# Patient Record
Sex: Male | Born: 1955 | Race: Asian | Hispanic: No | Marital: Married | Smoking: Never smoker
Health system: Southern US, Community
[De-identification: ages and names within clinical notes are randomized; demographics above are authoritative.]

## PROBLEM LIST (undated history)

## (undated) DIAGNOSIS — E7800 Pure hypercholesterolemia, unspecified: Secondary | ICD-10-CM

## (undated) DIAGNOSIS — N281 Cyst of kidney, acquired: Secondary | ICD-10-CM

---

## 2018-07-13 ENCOUNTER — Emergency Department (HOSPITAL_COMMUNITY): Payer: Self-pay

## 2018-07-13 ENCOUNTER — Inpatient Hospital Stay (HOSPITAL_COMMUNITY)
Admission: EM | Disposition: A | Discharge status: Home / Self Care | Payer: Self-pay | Source: Home / Self Care | Attending: Cardiovascular Disease

## 2018-07-13 ENCOUNTER — Inpatient Hospital Stay (HOSPITAL_COMMUNITY): Payer: Self-pay

## 2018-07-13 ENCOUNTER — Other Ambulatory Visit: Payer: Self-pay

## 2018-07-13 ENCOUNTER — Other Ambulatory Visit (HOSPITAL_COMMUNITY): Payer: Self-pay

## 2018-07-13 ENCOUNTER — Encounter (HOSPITAL_COMMUNITY): Payer: Self-pay | Admitting: Emergency Medicine

## 2018-07-13 ENCOUNTER — Inpatient Hospital Stay (HOSPITAL_COMMUNITY)
Admission: EM | Admit: 2018-07-13 | Discharge: 2018-07-17 | DRG: 281 | Disposition: A | Discharge status: Home / Self Care | Payer: Self-pay | Attending: Cardiovascular Disease | Admitting: Cardiovascular Disease

## 2018-07-13 DIAGNOSIS — I213 ST elevation (STEMI) myocardial infarction of unspecified site: Secondary | ICD-10-CM

## 2018-07-13 DIAGNOSIS — R079 Chest pain, unspecified: Secondary | ICD-10-CM

## 2018-07-13 DIAGNOSIS — I2510 Atherosclerotic heart disease of native coronary artery without angina pectoris: Secondary | ICD-10-CM

## 2018-07-13 DIAGNOSIS — I2102 ST elevation (STEMI) myocardial infarction involving left anterior descending coronary artery: Secondary | ICD-10-CM

## 2018-07-13 DIAGNOSIS — E785 Hyperlipidemia, unspecified: Secondary | ICD-10-CM | POA: Diagnosis present

## 2018-07-13 DIAGNOSIS — I25119 Atherosclerotic heart disease of native coronary artery with unspecified angina pectoris: Secondary | ICD-10-CM

## 2018-07-13 DIAGNOSIS — I97410 Intraoperative hemorrhage and hematoma of a circulatory system organ or structure complicating a cardiac catheterization: Secondary | ICD-10-CM

## 2018-07-13 DIAGNOSIS — I2541 Coronary artery aneurysm: Secondary | ICD-10-CM | POA: Diagnosis present

## 2018-07-13 DIAGNOSIS — I255 Ischemic cardiomyopathy: Secondary | ICD-10-CM | POA: Diagnosis present

## 2018-07-13 DIAGNOSIS — Z8249 Family history of ischemic heart disease and other diseases of the circulatory system: Secondary | ICD-10-CM

## 2018-07-13 DIAGNOSIS — I97630 Postprocedural hematoma of a circulatory system organ or structure following a cardiac catheterization: Secondary | ICD-10-CM | POA: Diagnosis not present

## 2018-07-13 DIAGNOSIS — E7800 Pure hypercholesterolemia, unspecified: Secondary | ICD-10-CM | POA: Diagnosis present

## 2018-07-13 HISTORY — PX: LEFT HEART CATH AND CORONARY ANGIOGRAPHY: CATH118249

## 2018-07-13 HISTORY — DX: Pure hypercholesterolemia, unspecified: E78.00

## 2018-07-13 HISTORY — DX: Cyst of kidney, acquired: N28.1

## 2018-07-13 LAB — CBC
HCT: 47.3 % (ref 39.0–52.0)
Hemoglobin: 15 g/dL (ref 13.0–17.0)
MCH: 27.8 pg (ref 26.0–34.0)
MCHC: 31.7 g/dL (ref 30.0–36.0)
MCV: 87.8 fL (ref 80.0–100.0)
Platelets: 212 10*3/uL (ref 150–400)
RBC: 5.39 MIL/uL (ref 4.22–5.81)
RDW: 12.5 % (ref 11.5–15.5)
WBC: 11.7 10*3/uL — ABNORMAL HIGH (ref 4.0–10.5)
nRBC: 0 % (ref 0.0–0.2)

## 2018-07-13 LAB — BASIC METABOLIC PANEL
Anion gap: 13 (ref 5–15)
BUN: 16 mg/dL (ref 8–23)
CO2: 24 mmol/L (ref 22–32)
Calcium: 9.4 mg/dL (ref 8.9–10.3)
Chloride: 102 mmol/L (ref 98–111)
Creatinine, Ser: 1.17 mg/dL (ref 0.61–1.24)
GFR calc Af Amer: >60 mL/min (ref >60)
GFR calc non Af Amer: >60 mL/min (ref >60)
Glucose, Bld: 129 mg/dL — ABNORMAL HIGH (ref 70–99)
Potassium: 3.3 mmol/L — ABNORMAL LOW (ref 3.5–5.1)
Sodium: 139 mmol/L (ref 135–145)

## 2018-07-13 LAB — ECHOCARDIOGRAM COMPLETE: Weight: 2720 oz

## 2018-07-13 LAB — TROPONIN I
Troponin I: 5.52 ng/mL (ref <0.03)
Troponin I: >65 ng/mL (ref <0.03)
Troponin I: >65 ng/mL (ref <0.03)

## 2018-07-13 LAB — MRSA PCR SCREENING: MRSA by PCR: NEGATIVE

## 2018-07-13 LAB — I-STAT TROPONIN, ED: Troponin i, poc: 0.01 ng/mL (ref 0.00–0.08)

## 2018-07-13 LAB — HEPARIN LEVEL (UNFRACTIONATED): Heparin Unfractionated: 0.34 IU/mL (ref 0.30–0.70)

## 2018-07-13 LAB — HIV ANTIBODY (ROUTINE TESTING W REFLEX): HIV Screen 4th Generation wRfx: NONREACTIVE

## 2018-07-13 LAB — PROTIME-INR
INR: 0.98
Prothrombin Time: 12.9 seconds (ref 11.4–15.2)

## 2018-07-13 LAB — ABO/RH: ABO/RH(D): A POS

## 2018-07-13 LAB — TYPE AND SCREEN
ABO/RH(D): A POS
Antibody Screen: NEGATIVE

## 2018-07-13 SURGERY — LEFT HEART CATH AND CORONARY ANGIOGRAPHY
Anesthesia: LOCAL

## 2018-07-13 MED ORDER — HEPARIN (PORCINE) IN NACL 1000-0.9 UT/500ML-% IV SOLN
INTRAVENOUS | Status: DC | PRN
  Administered 2018-07-13 (×2): 500 mL

## 2018-07-13 MED ORDER — FENTANYL CITRATE (PF) 100 MCG/2ML IJ SOLN
INTRAMUSCULAR | Status: AC
  Administered 2018-07-13: 50 ug
  Filled 2018-07-13: qty 2

## 2018-07-13 MED ORDER — ONDANSETRON HCL 4 MG/2ML IJ SOLN
4.0000 mg | Freq: Four times a day (QID) | INTRAMUSCULAR | Status: DC | PRN
  Administered 2018-07-16: 4 mg via INTRAVENOUS
  Filled 2018-07-13: qty 2

## 2018-07-13 MED ORDER — NITROGLYCERIN 0.4 MG SL SUBL
0.4000 mg | SUBLINGUAL_TABLET | SUBLINGUAL | Status: AC | PRN
  Administered 2018-07-13: 0.4 mg via SUBLINGUAL

## 2018-07-13 MED ORDER — SODIUM CHLORIDE 0.9 % IV SOLN
INTRAVENOUS | Status: AC | PRN
  Administered 2018-07-13: 999 mL/h via INTRAVENOUS
  Administered 2018-07-13: 75 mL/h via INTRAVENOUS

## 2018-07-13 MED ORDER — NITROGLYCERIN 0.4 MG SL SUBL
SUBLINGUAL_TABLET | SUBLINGUAL | Status: AC
  Filled 2018-07-13: qty 1

## 2018-07-13 MED ORDER — VERAPAMIL HCL 2.5 MG/ML IV SOLN
INTRAVENOUS | Status: AC
  Filled 2018-07-13: qty 2

## 2018-07-13 MED ORDER — IOPAMIDOL (ISOVUE-370) INJECTION 76%
80.0000 mL | Freq: Once | INTRAVENOUS | Status: AC | PRN
  Administered 2018-07-13: 80 mL via INTRAVENOUS

## 2018-07-13 MED ORDER — ACETAMINOPHEN 325 MG PO TABS: 650.0000 mg | ORAL_TABLET | ORAL | Status: DC | PRN

## 2018-07-13 MED ORDER — SODIUM CHLORIDE 0.9% FLUSH
3.0000 mL | Freq: Two times a day (BID) | INTRAVENOUS | Status: DC
  Administered 2018-07-14 – 2018-07-17 (×5): 3 mL via INTRAVENOUS

## 2018-07-13 MED ORDER — MORPHINE SULFATE (PF) 2 MG/ML IV SOLN: 2.0000 mg | INTRAVENOUS | Status: DC | PRN

## 2018-07-13 MED ORDER — IOHEXOL 350 MG/ML SOLN
INTRAVENOUS | Status: DC | PRN
  Administered 2018-07-13: 110 mL via INTRA_ARTERIAL

## 2018-07-13 MED ORDER — LIDOCAINE HCL (PF) 1 % IJ SOLN
INTRAMUSCULAR | Status: AC
  Filled 2018-07-13: qty 30

## 2018-07-13 MED ORDER — SODIUM CHLORIDE 0.9 % IV SOLN
INTRAVENOUS | Status: DC | PRN
  Administered 2018-07-13: 10 mL/h via INTRAVENOUS

## 2018-07-13 MED ORDER — HEPARIN SODIUM (PORCINE) 5000 UNIT/ML IJ SOLN
4000.0000 [IU] | Freq: Once | INTRAMUSCULAR | Status: AC
  Administered 2018-07-13: 4000 [IU] via INTRAVENOUS

## 2018-07-13 MED ORDER — LIDOCAINE HCL (CARDIAC) PF 100 MG/5ML IV SOSY
50.0000 mg | PREFILLED_SYRINGE | Freq: Once | INTRAVENOUS | Status: AC
  Administered 2018-07-13: 50 mg via INTRAVENOUS

## 2018-07-13 MED ORDER — SODIUM CHLORIDE 0.9 % IV SOLN: 250.0000 mL | INTRAVENOUS | Status: DC | PRN

## 2018-07-13 MED ORDER — SODIUM CHLORIDE 0.9 % IV BOLUS
1000.0000 mL | INTRAVENOUS | Status: AC
  Administered 2018-07-13: 1000 mL via INTRAVENOUS

## 2018-07-13 MED ORDER — HEPARIN SODIUM (PORCINE) 5000 UNIT/ML IJ SOLN
INTRAMUSCULAR | Status: AC
  Filled 2018-07-13: qty 1

## 2018-07-13 MED ORDER — NITROGLYCERIN 0.4 MG SL SUBL
0.4000 mg | SUBLINGUAL_TABLET | SUBLINGUAL | Status: DC | PRN
  Administered 2018-07-13: 0.4 mg via SUBLINGUAL
  Filled 2018-07-13: qty 1

## 2018-07-13 MED ORDER — HEPARIN (PORCINE) 25000 UT/250ML-% IV SOLN
1000.0000 [IU]/h | INTRAVENOUS | Status: DC
  Administered 2018-07-13 – 2018-07-16 (×4): 1000 [IU]/h via INTRAVENOUS
  Filled 2018-07-13 (×4): qty 250

## 2018-07-13 MED ORDER — NITROGLYCERIN IN D5W 200-5 MCG/ML-% IV SOLN
0.0000 ug/min | INTRAVENOUS | Status: DC
  Administered 2018-07-13: 5 ug/kg/min via INTRAVENOUS

## 2018-07-13 MED ORDER — SODIUM CHLORIDE 0.9 % IV SOLN: INTRAVENOUS | Status: AC

## 2018-07-13 MED ORDER — ASPIRIN 81 MG PO CHEW
81.0000 mg | CHEWABLE_TABLET | Freq: Every day | ORAL | Status: DC
  Administered 2018-07-14: 81 mg via ORAL
  Filled 2018-07-13: qty 1

## 2018-07-13 MED ORDER — NITROGLYCERIN 0.4 MG SL SUBL
SUBLINGUAL_TABLET | SUBLINGUAL | Status: AC
  Administered 2018-07-13: 0.04 mg
  Filled 2018-07-13: qty 1

## 2018-07-13 MED ORDER — HEPARIN (PORCINE) IN NACL 1000-0.9 UT/500ML-% IV SOLN
INTRAVENOUS | Status: AC
  Filled 2018-07-13: qty 1000

## 2018-07-13 MED ORDER — ASPIRIN EC 81 MG PO TBEC
81.0000 mg | DELAYED_RELEASE_TABLET | Freq: Every day | ORAL | Status: DC
  Administered 2018-07-15 – 2018-07-17 (×3): 81 mg via ORAL
  Filled 2018-07-13 (×4): qty 1

## 2018-07-13 MED ORDER — NITROGLYCERIN IN D5W 200-5 MCG/ML-% IV SOLN
INTRAVENOUS | Status: AC
  Administered 2018-07-13: 5 ug/kg/min via INTRAVENOUS
  Filled 2018-07-13: qty 250

## 2018-07-13 MED ORDER — ASPIRIN 81 MG PO CHEW
324.0000 mg | CHEWABLE_TABLET | Freq: Once | ORAL | Status: AC
  Administered 2018-07-13: 324 mg via ORAL
  Filled 2018-07-13: qty 4

## 2018-07-13 MED ORDER — NITROGLYCERIN 1 MG/10 ML FOR IR/CATH LAB
INTRA_ARTERIAL | Status: AC
  Filled 2018-07-13: qty 10

## 2018-07-13 MED ORDER — ACETAMINOPHEN 325 MG PO TABS
650.0000 mg | ORAL_TABLET | ORAL | Status: DC | PRN
  Filled 2018-07-13: qty 2

## 2018-07-13 MED ORDER — LIDOCAINE HCL (PF) 1 % IJ SOLN
INTRAMUSCULAR | Status: DC | PRN
  Administered 2018-07-13: 25 mL

## 2018-07-13 MED ORDER — SODIUM CHLORIDE 0.9% FLUSH: 3.0000 mL | INTRAVENOUS | Status: DC | PRN

## 2018-07-13 MED ORDER — ONDANSETRON HCL 4 MG/2ML IJ SOLN: 4.0000 mg | Freq: Four times a day (QID) | INTRAMUSCULAR | Status: DC | PRN

## 2018-07-13 MED ORDER — SODIUM CHLORIDE 0.9% FLUSH
3.0000 mL | Freq: Once | INTRAVENOUS | Status: AC
  Administered 2018-07-13: 3 mL via INTRAVENOUS

## 2018-07-13 MED FILL — Medication: Qty: 1 | Status: AC

## 2018-07-13 SURGICAL SUPPLY — 9 items
CATH INFINITI 5FR MULTPACK ANG (CATHETERS) ×2 IMPLANT
CLOSURE MYNX CONTROL 6F/7F (Vascular Products) ×2 IMPLANT
KIT HEART LEFT (KITS) ×2 IMPLANT
PACK CARDIAC CATHETERIZATION (CUSTOM PROCEDURE TRAY) ×2 IMPLANT
SHEATH PINNACLE 6F 10CM (SHEATH) ×2 IMPLANT
SYR MEDRAD MARK 7 150ML (SYRINGE) ×2 IMPLANT
TRANSDUCER W/STOPCOCK (MISCELLANEOUS) ×2 IMPLANT
TUBING CIL FLEX 10 FLL-RA (TUBING) ×2 IMPLANT
WIRE EMERALD 3MM-J .035X150CM (WIRE) ×2 IMPLANT

## 2018-07-13 NOTE — ED Notes (Signed)
Patient keeps repeating that he "doesn't feel well".

## 2018-07-13 NOTE — ED Notes (Signed)
Called for STAT Portable chest x-ray

## 2018-07-13 NOTE — Progress Notes (Signed)
ANTICOAGULATION CONSULT NOTE - Initial Consult  Pharmacy Consult for heparin Indication: chest pain/ACS  No Known Allergies  Patient Measurements: Weight: 170 lb (77.1 kg) Heparin Dosing Weight: 77kg  Vital Signs: Temp: 99.6 F (37.6 C) (02/14 1139) Temp Source: Oral (02/14 1139) BP: 136/74 (02/14 1300) Pulse Rate: 71 (02/14 1300)  Labs: Recent Labs    07/13/18 0422 07/13/18 0718  HGB 15.0  --   HCT 47.3  --   PLT 212  --   LABPROT 12.9  --   INR 0.98  --   CREATININE 1.17  --   TROPONINI  --  5.52*    CrCl cannot be calculated (Unknown ideal weight.).   Medical History: Past Medical History:  Diagnosis Date  . High cholesterol   . Renal cyst      Assessment: 73 yoM admitted as code STEMI. 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 Pharmacy consulted to begin IV heparin for medical management of ACS. Femoral sheath removed at 0530 this am.  Goal of Therapy:  Heparin level 0.3-0.7 units/ml Monitor platelets by anticoagulation protocol: Yes   Plan:  -Start heparin 1000 units/hr with no bolus -Check 6hr heparin level -Follow-up length of therapy and longterm AC plans  Fredonia Highland, PharmD, BCPS Clinical Pharmacist [phone] Please check AMION for all [facility] numbers 07/13/2018

## 2018-07-13 NOTE — ED Notes (Signed)
Vital signs stable. 

## 2018-07-13 NOTE — H&P (Signed)
Cardiology History & Physical    Patient ID: Bryan Vazquez MRN: [id number], DOB: Feb 16, 1956 Date of Encounter: 07/13/2018, 5:01 AM Primary Physician: Patient, No Pcp Per  Chief Complaint: Chest pain   HPI: Bryan Vazquez is a 63 y.o. male with history of HLD who presents with chest pain.  Pt is Mandarin speaking and history is obtained with the aid of an interpreter and the pt's daughter, who is at the bedside.  Pt had an episode of substernal CP yesterday which resolved with an unspecified herbal remedy.  He awoke from sleep at [facility] with acute substernal chest pain.  He was brought to the [facility] ED.  Initial 12 lead showed ST elevation in I and aVL and hyperacute T waves in the anterior leads.  A code STEMI was called.  He was given full dose ASA, 4000 units IV heparin, 3 SL nitro with improvement but not resolution in his chest pain.  He was having frequent ectopy, and was given 50 mg IV lidocaine.  His BP was 130/80s initially, but he dropped his BPs to the 80s-90s after nitro.  Of note, he has no history of smoking, hypertension or diabetes.  He does have a family of history of CAD, with both his brother and father having Mis.  Past Medical History:  Diagnosis Date  . High cholesterol   . Renal cyst      Surgical History: History reviewed. No pertinent surgical history.   Home Meds: Prior to Admission medications   Not on File    Allergies: No Known Allergies  Social History   Socioeconomic History  . Marital status: Married    Spouse name: Not on file  . Number of children: Not on file  . Years of education: Not on file  . Highest education level: Not on file  Occupational History  . Not on file  Social Needs  . Financial resource strain: Not on file  . Food insecurity:    Worry: Not on file    Inability: Not on file  . Transportation needs:    Medical: Not on file    Non-medical: Not on file  Tobacco Use  . Smoking status: Never Smoker  . Smokeless tobacco:  Never Used  Substance and Sexual Activity  . Alcohol use: Never    Frequency: Never  . Drug use: Never  . Sexual activity: Yes  Lifestyle  . Physical activity:    Days per week: Not on file    Minutes per session: Not on file  . Stress: Not on file  Relationships  . Social connections:    Talks on phone: Not on file    Gets together: Not on file    Attends religious service: Not on file    Active member of club or organization: Not on file    Attends meetings of clubs or organizations: Not on file    Relationship status: Not on file  . Intimate partner violence:    Fear of current or ex partner: Not on file    Emotionally abused: Not on file    Physically abused: Not on file    Forced sexual activity: Not on file  Other Topics Concern  . Not on file  Social History Narrative  . Not on file     History reviewed. No pertinent family history.  Review of Systems: All other systems reviewed and are otherwise negative except as noted above.  Labs:  No results found for: WBC, HGB, HCT,  MCV, PLT No results for input(s): NA, K, CL, CO2, BUN, CREATININE, CALCIUM, PROT, BILITOT, ALKPHOS, ALT, AST, GLUCOSE in the last 168 hours.  Invalid input(s): LABALBU No results for input(s): CKTOTAL, CKMB, TROPONINI in the last 72 hours. No results found for: CHOL, HDL, LDLCALC, TRIG No results found for: DDIMER  Radiology/Studies:  Dg Chest Portable 1 View  Result Date: 07/13/2018 CLINICAL DATA:  ST-elevation myocardial infarction EXAM: PORTABLE CHEST 1 VIEW COMPARISON:  None. FINDINGS: Cardiomegaly. The aortic knob is distinct. Separate upper mediastinal widening which is likely vascular tortuosity, no pleural capping or mainstem depression. Vascular congestion without Kerley lines. No effusion or pneumothorax. IMPRESSION: Cardiomegaly and vascular congestion. Electronically Signed   By: Marnee Spring M.D.   On: 07/13/2018 04:31   Wt Readings from Last 3 Encounters:  07/13/18 77.1 kg     EKG: NSR, lateral STEMI.  Physical Exam: Blood pressure 104/76, pulse 63, temperature 97.8 F (36.6 C), resp. rate 20, weight 77.1 kg, SpO2 97 %. There is no height or weight on file to calculate BMI. General: Well developed, well nourished, in no acute distress. Head: Normocephalic, atraumatic, sclera non-icteric, no xanthomas, nares are without discharge.  Neck: Negative for carotid bruits. JVD not elevated. Lungs: Clear bilaterally to auscultation without wheezes, rales, or rhonchi. Breathing is unlabored. Heart: RRR with S1 S2. No murmurs, rubs, or gallops appreciated. Abdomen: Soft, non-tender, non-distended with normoactive bowel sounds. No hepatomegaly. No rebound/guarding. No obvious abdominal masses. Msk:  Strength and tone appear normal for age. Extremities: No clubbing or cyanosis. No edema.  Distal pedal pulses are 2+ and equal bilaterally. Neuro: Alert and oriented X 3. No focal deficit. No facial asymmetry. Moves all extremities spontaneously. Psych:  Responds to questions appropriately with a normal affect.    Assessment and Plan  33M who presents with CP and found to have STEMI.  Plan for emergent cardiac catheterization and possible PCI.  Signed, Esmond Plants, MD 07/13/2018, 5:01 AM

## 2018-07-13 NOTE — ED Notes (Signed)
Paged Stemi 

## 2018-07-13 NOTE — ED Triage Notes (Signed)
Patient c/o CP that started yesterday after taking some "chinese" medication and it resolved. Started having CP tonight and took medicine again with no resolution. Speak Mandarin ([place]). Daughter interpreting for pt

## 2018-07-13 NOTE — Progress Notes (Signed)
   07/13/18 0500  Clinical Encounter Type  Visited With Family  Visit Type Initial;ED;Code (STEMI)  Referral From Other (Comment) (STEMI pg)  Spiritual Encounters  Spiritual Needs Emotional;Other (Comment) (advocate for translation machine use)  Stress Factors  Patient Stress Factors Health changes  Family Stress Factors Loss of control;Lack of knowledge   Responded to STEMI in ED.  Spoke w/ pt's daughter and wife.  Pt's daughter is fluent in [place], but not in medical terms.  Ensured ED Stratus video interpreter went w/ team to CL.  Also spoke to CL team member to ensure they knew to return that Stratus to ED at end of procedure.  Walked daughter and wife to [facility] waiting, CL knows they are there.  Compassionate presence w/ family.  Also spoke w/ 2H and brought their Stratus out to [facility] waiting for when CL dr comes out to talk to family so they can have translation for pt's wife.  Showed family how to use machine, explained that it needs to be returned to [facility] unit when they finish conversation w/ dr in [facility] waiting rm.  Also showed daughter the entrance to 2H and location of doorbell for entrance.  Chaplain remains available.  Margretta Sidle resident, [phone]

## 2018-07-13 NOTE — Progress Notes (Signed)
  Echocardiogram 2D Echocardiogram has been performed.  Bryan Vazquez 07/13/2018, 10:11 AM

## 2018-07-13 NOTE — Progress Notes (Signed)
  Reviewed cath films.  D/w Dr. Allyson Sabal and Dr. Shirlee Latch.  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 Discussed with the patient's daughter aswell.  Corky Crafts, MD

## 2018-07-13 NOTE — ED Notes (Signed)
Family at bedside. 

## 2018-07-13 NOTE — Progress Notes (Signed)
   Patient with unrevascularized anterior MI.  Large anterior wall motion abnormality.  Start IV heparin. May need long term oral anticoagulation to prevent LV thrombus. Consider LifeVest based on EF.  He is visiting from [place].  Await CT results.  If any surgical intervention is needed, he wants to have it in [place].   Corky Crafts, MD

## 2018-07-13 NOTE — ED Provider Notes (Signed)
[facility] Provider Note   CSN: [id number] Arrival date & time: 07/13/18  0404     History   Chief Complaint Chief Complaint  Patient presents with  . Code STEMI    HPI Bryan Vazquez is a 63 y.o. male.  The history is provided by the patient, a relative and the spouse. The history is limited by a language barrier. A language interpreter was used.  Chest Pain  Pain location:  Substernal area Pain radiates to:  Upper back, L shoulder and L arm Pain severity:  Severe Onset quality:  Sudden Duration:  40 minutes Timing:  Constant Progression:  Unchanged Chronicity:  Recurrent (Had an episode during the day yesterday and took some Cong[place] herbal medication and it subsided.  ) Context: at rest   Relieved by:  Nothing Worsened by:  Nothing Ineffective treatments:  None tried Associated symptoms: dizziness   Associated symptoms: no abdominal pain, no cough, no diaphoresis, no nausea, no palpitations and no vomiting   Risk factors: high cholesterol and male sex   Patient with hypercholesterolemia who presents with SSCP that began at 330 am. No f/c/r.  No leg pain.  Has been visiting for the last 6 months. Pain radiates to upper back and LUE.  EKG in triage with STEMI, alert called immediately.    Past Medical History:  Diagnosis Date  . High cholesterol   . Renal cyst     There are no active problems to display for this patient.   History reviewed. No pertinent surgical history.      Home Medications    Prior to Admission medications   Not on File    Family History History reviewed. No pertinent family history.  Social History Social History   Tobacco Use  . Smoking status: Never Smoker  . Smokeless tobacco: Never Used  Substance Use Topics  . Alcohol use: Never    Frequency: Never  . Drug use: Never     Allergies   Patient has no known allergies.   Review of Systems Review of Systems  Constitutional:  Negative for diaphoresis.  Respiratory: Negative for cough.   Cardiovascular: Positive for chest pain. Negative for palpitations.  Gastrointestinal: Negative for abdominal pain, nausea and vomiting.  Neurological: Positive for dizziness.  All other systems reviewed and are negative.    Physical Exam Updated Vital Signs BP 126/70   Pulse 64   Temp 97.8 F (36.6 C)   Resp 18   Wt 77.1 kg   SpO2 96%   Physical Exam Vitals signs and nursing note reviewed.  Constitutional:      Appearance: He is ill-appearing.  HENT:     Head: Normocephalic and atraumatic.     Nose: Nose normal.     Mouth/Throat:     Mouth: Mucous membranes are moist.     Pharynx: Oropharynx is clear.  Eyes:     Conjunctiva/sclera: Conjunctivae normal.     Pupils: Pupils are equal, round, and reactive to light.  Neck:     Musculoskeletal: Normal range of motion and neck supple.     Vascular: No carotid bruit.  Cardiovascular:     Rate and Rhythm: Normal rate and regular rhythm.     Pulses: Normal pulses.     Heart sounds: Normal heart sounds.  Pulmonary:     Effort: Pulmonary effort is normal.     Breath sounds: Normal breath sounds. No rales.  Abdominal:     General: Abdomen is flat.  Bowel sounds are normal.     Tenderness: There is no abdominal tenderness. There is no guarding or rebound.  Musculoskeletal: Normal range of motion.        General: No tenderness.     Right lower leg: No edema.     Left lower leg: No edema.  Skin:    General: Skin is warm and dry.     Capillary Refill: Capillary refill takes less than 2 seconds.  Neurological:     General: No focal deficit present.     Mental Status: He is alert.     Deep Tendon Reflexes: Reflexes normal.      ED Treatments / Results  Labs (all labs ordered are listed, but only abnormal results are displayed) Results for orders placed or performed during the hospital encounter of 07/13/18  I-Stat Troponin, ED - 0, 3, 6 hours (not at [facility])    Result Value Ref Range   Troponin i, poc 0.01 0.00 - 0.08 ng/mL   Comment 3           Dg Chest Portable 1 View  Result Date: 07/13/2018 CLINICAL DATA:  ST-elevation myocardial infarction EXAM: PORTABLE CHEST 1 VIEW COMPARISON:  None. FINDINGS: Cardiomegaly. The aortic knob is distinct. Separate upper mediastinal widening which is likely vascular tortuosity, no pleural capping or mainstem depression. Vascular congestion without Kerley lines. No effusion or pneumothorax. IMPRESSION: Cardiomegaly and vascular congestion. Electronically Signed   By: Marnee Spring M.D.   On: 07/13/2018 04:31    EKG 2 EKG Interpretation  Date/Time:  Friday July 13 2018 04:06:28 EST Ventricular Rate:  59 PR Interval:  144 QRS Duration: 112 QT Interval:  432 QTC Calculation: 427 R Axis:     Text Interpretation:  Sinus bradycardia ST elevation consider lateral injury or acute infarct ** ** ACUTE MI / STEMI ** ** Confirmed by Nicanor Alcon, Idara Woodside ([id number]) on 07/13/2018 4:38:37 AM   Radiology Dg Chest Portable 1 View  Result Date: 07/13/2018 CLINICAL DATA:  ST-elevation myocardial infarction EXAM: PORTABLE CHEST 1 VIEW COMPARISON:  None. FINDINGS: Cardiomegaly. The aortic knob is distinct. Separate upper mediastinal widening which is likely vascular tortuosity, no pleural capping or mainstem depression. Vascular congestion without Kerley lines. No effusion or pneumothorax. IMPRESSION: Cardiomegaly and vascular congestion. Electronically Signed   By: Marnee Spring M.D.   On: 07/13/2018 04:31    Procedures Procedures (including critical care time)  Medications Ordered in ED Medications  nitroGLYCERIN 50 mg in dextrose 5 % 250 mL (0.2 mg/mL) infusion (0 mcg/min Intravenous Stopped 07/13/18 0442)  sodium chloride flush (NS) 0.9 % injection 3 mL (3 mLs Intravenous Given 07/13/18 0421)  aspirin chewable tablet 324 mg (324 mg Oral Given 07/13/18 0425)  heparin injection 4,000 Units (4,000 Units Intravenous Given  07/13/18 0424)  fentaNYL (SUBLIMAZE) 100 MCG/2ML injection (50 mcg  Given 07/13/18 0425)  nitroGLYCERIN (NITROSTAT) 0.4 MG SL tablet (0.04 mg  Given 07/13/18 0426)  nitroGLYCERIN (NITROSTAT) SL tablet 0.4 mg (0.4 mg Sublingual Given 07/13/18 0436)  lidocaine (cardiac) 100 mg/2mL (XYLOCAINE) injection 2% 50 mg (50 mg Intravenous Given 07/13/18 0444)     MDM Reviewed: nursing note and vitals Interpretation: x-ray, ECG and labs (no PNA by me on cxr, negative first troponin) Total time providing critical care: 30-74 minutes. This excludes time spent performing separately reportable procedures and services. Consults: cardiology   CRITICAL CARE Performed by: Taivon Haroon K Callan Norden-Rasch Total critical care time: 45 minutes Critical care time was exclusive of separately billable procedures and treating  other patients. Critical care was necessary to treat or prevent imminent or life-threatening deterioration. Critical care was time spent personally by me on the following activities: development of treatment plan with patient and/or surrogate as well as nursing, discussions with consultants, evaluation of patient's response to treatment, examination of patient, obtaining history from patient or surrogate, ordering and performing treatments and interventions, ordering and review of laboratory studies, ordering and review of radiographic studies, pulse oximetry and re-evaluation of patient's condition.  Final Clinical Impressions(s) / ED Diagnoses   Final diagnoses:  ST elevation myocardial infarction (STEMI), unspecified artery [facility])    Admit to cath lab   Michai Dieppa, MD 07/13/18 9169

## 2018-07-13 NOTE — Progress Notes (Signed)
ANTICOAGULATION CONSULT NOTE  Pharmacy Consult for Heparin Indication: chest pain/ACS  No Known Allergies  Patient Measurements: Weight: 170 lb (77.1 kg) Heparin Dosing Weight: 77kg  Vital Signs: Temp: 98 F (36.7 C) (02/14 2007) Temp Source: Oral (02/14 2007) BP: 112/68 (02/14 2200) Pulse Rate: 79 (02/14 2200)  Labs: Recent Labs    07/13/18 0422 07/13/18 0718 07/13/18 1438 07/13/18 1756 07/13/18 2215  HGB 15.0  --   --   --   --   HCT 47.3  --   --   --   --   PLT 212  --   --   --   --   LABPROT 12.9  --   --   --   --   INR 0.98  --   --   --   --   HEPARINUNFRC  --   --   --   --  0.34  CREATININE 1.17  --   --   --   --   TROPONINI  --  5.52* >65.00* >65.00*  --     CrCl cannot be calculated (Unknown ideal weight.).   Medical History: Past Medical History:  Diagnosis Date  . High cholesterol   . Renal cyst      Assessment: 55 yoM admitted as code STEMI. 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 Pharmacy consulted to begin IV heparin for medical management of ACS. Femoral sheath removed at 0530 this am.  2/14 PM update: initial heparin level therapeutic   Goal of Therapy:  Heparin level 0.3-0.7 units/ml Monitor platelets by anticoagulation protocol: Yes   Plan:  -Cont heparin at 1000 units/hr -Confirmatory heparin level with AM labs  Abran Duke, PharmD, BCPS Clinical Pharmacist Phone: [phone]

## 2018-07-13 NOTE — ED Notes (Signed)
Patient to be transported to cath lab.

## 2018-07-14 DIAGNOSIS — I2102 ST elevation (STEMI) myocardial infarction involving left anterior descending coronary artery: Principal | ICD-10-CM

## 2018-07-14 LAB — CBC
HCT: 41.7 % (ref 39.0–52.0)
Hemoglobin: 13.7 g/dL (ref 13.0–17.0)
MCH: 28.1 pg (ref 26.0–34.0)
MCHC: 32.9 g/dL (ref 30.0–36.0)
MCV: 85.5 fL (ref 80.0–100.0)
Platelets: 169 10*3/uL (ref 150–400)
RBC: 4.88 MIL/uL (ref 4.22–5.81)
RDW: 12.6 % (ref 11.5–15.5)
WBC: 10.8 10*3/uL — ABNORMAL HIGH (ref 4.0–10.5)
nRBC: 0 % (ref 0.0–0.2)

## 2018-07-14 LAB — BASIC METABOLIC PANEL
ANION GAP: 11 (ref 5–15)
BUN: 13 mg/dL (ref 8–23)
CALCIUM: 9 mg/dL (ref 8.9–10.3)
CO2: 23 mmol/L (ref 22–32)
Chloride: 104 mmol/L (ref 98–111)
Creatinine, Ser: 1.06 mg/dL (ref 0.61–1.24)
GFR calc Af Amer: >60 mL/min (ref >60)
GFR calc non Af Amer: >60 mL/min (ref >60)
Glucose, Bld: 111 mg/dL — ABNORMAL HIGH (ref 70–99)
Potassium: 3.4 mmol/L — ABNORMAL LOW (ref 3.5–5.1)
Sodium: 138 mmol/L (ref 135–145)

## 2018-07-14 LAB — LIPID PANEL
Cholesterol: 221 mg/dL — ABNORMAL HIGH (ref 0–200)
HDL: 50 mg/dL (ref >40)
LDL CALC: 157 mg/dL — AB (ref 0–99)
Total CHOL/HDL Ratio: 4.4 RATIO
Triglycerides: 68 mg/dL (ref <150)
VLDL: 14 mg/dL (ref 0–40)

## 2018-07-14 LAB — HEPARIN LEVEL (UNFRACTIONATED): Heparin Unfractionated: 0.46 IU/mL (ref 0.30–0.70)

## 2018-07-14 MED ORDER — ATORVASTATIN CALCIUM 80 MG PO TABS
80.0000 mg | ORAL_TABLET | Freq: Every day | ORAL | Status: DC
  Administered 2018-07-14 – 2018-07-16 (×3): 80 mg via ORAL
  Filled 2018-07-14 (×3): qty 1

## 2018-07-14 MED ORDER — SPIRONOLACTONE 12.5 MG HALF TABLET
12.5000 mg | ORAL_TABLET | Freq: Every day | ORAL | Status: DC
  Administered 2018-07-14 – 2018-07-17 (×4): 12.5 mg via ORAL
  Filled 2018-07-14 (×4): qty 1

## 2018-07-14 MED ORDER — POTASSIUM CHLORIDE CRYS ER 20 MEQ PO TBCR
40.0000 meq | EXTENDED_RELEASE_TABLET | Freq: Once | ORAL | Status: AC
  Administered 2018-07-14: 40 meq via ORAL
  Filled 2018-07-14: qty 2

## 2018-07-14 NOTE — Progress Notes (Signed)
CARDIAC REHAB PHASE I   PRE:  Rate/Rhythm: 68 SR  BP:  Supine: 85/47  Sitting: 108/66  Standing:    SaO2: 94 RA  MODE:  Ambulation: 470 ft   POST:  Rate/Rhythm: 80 SR  BP:  Supine:   Sitting: 91/51  Standing:    SaO2: 94 RA 1615-1700 On arrival pt's BP in bed 85/47 sitting 108/66. Questioned pt if he was feeling dizzy of lightheaded. He denies any at present, but states that he has felt some. I cautioned him to sit on side of bed before he stood and to call if he feels dizzy. I instructed him to call for assistance if he needed to go to the bathroom. Pt tolerated ambulation well, denies any chest pain. He has a fast pace walk. Pt's daughter states that he and his wife walk 2 hours daily.  Melina Copa RN 07/14/2018 5:05 PM

## 2018-07-14 NOTE — Progress Notes (Signed)
Report called to 6E.   Pt and family concerned about cost of care as pt is visiting from [place] and does not have coverage for [place] medical care. Extensive conversations with Dr. Shirlee Latch regarding feasibility of postponing further interventions until return to [place]. SW notified of pt concerns and family given information to contact billing department.

## 2018-07-14 NOTE — Progress Notes (Signed)
ANTICOAGULATION CONSULT NOTE  Pharmacy Consult for Heparin Indication: chest pain/ACS  No Known Allergies  Patient Measurements: Weight: 170 lb (77.1 kg) Heparin Dosing Weight: 77kg  Vital Signs: Temp: 99 F (37.2 C) (02/15 0913) Temp Source: Oral (02/15 0913) BP: 97/63 (02/15 0600) Pulse Rate: 60 (02/15 0600)  Labs: Recent Labs    07/13/18 0422 07/13/18 0718 07/13/18 1438 07/13/18 1756 07/13/18 2215 07/14/18 0259  HGB 15.0  --   --   --   --  13.7  HCT 47.3  --   --   --   --  41.7  PLT 212  --   --   --   --  169  LABPROT 12.9  --   --   --   --   --   INR 0.98  --   --   --   --   --   HEPARINUNFRC  --   --   --   --  0.34 0.46  CREATININE 1.17  --   --   --   --  1.06  TROPONINI  --  5.52* >65.00* >65.00*  --   --     CrCl cannot be calculated (Unknown ideal weight.).   Medical History: Past Medical History:  Diagnosis Date  . High cholesterol   . Renal cyst      Assessment: 62 yoM admitted as code STEMI. 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 Pharmacy consulted to begin IV heparin for medical management of ACS. Femoral sheath removed at 0530 this am.  Heparin level still within goal range. Cardiology planning cardiac MRI to check viability for surgery. Will continue heparin for now, may transition to warfarin pending surgical decisions.   Hgb down from admit 15>13.7, no bleeding noted.   Goal of Therapy:  Heparin level 0.3-0.7 units/ml Monitor platelets by anticoagulation protocol: Yes   Plan:  -Cont heparin at 1000 units/hr  Sheppard Coil PharmD., BCPS Clinical Pharmacist 07/14/2018 10:03 AM

## 2018-07-14 NOTE — Progress Notes (Signed)
Patient ID: Bryan Vazquez, male   DOB: 07/10/1955, 63 y.o.   MRN: [id number]     Advanced Heart Failure Rounding Note  PCP-Cardiologist: No primary care provider on file.   Subjective:    No chest pain.  He feels good this morning, no complaints.  Now off NTG. SBP 90s generally.   Echo: EF 35-40% with LAD territory WMAs, RV normal.    Objective:   Weight Range: 77.1 kg There is no height or weight on file to calculate BMI.   Vital Signs:   Temp:  [92 F (33.3 C)-99.6 F (37.6 C)] 92 F (33.3 C) (02/15 0600) Pulse Rate:  [57-79] 60 (02/15 0600) Resp:  [15-25] 19 (02/15 0600) BP: (82-138)/(42-74) 97/63 (02/15 0600) SpO2:  [92 %-97 %] 96 % (02/15 0600) Last BM Date: (PTA)  Weight change: Filed Weights   07/13/18 0400  Weight: 77.1 kg    Intake/Output:   Intake/Output Summary (Last 24 hours) at 07/14/2018 0823 Last data filed at 07/14/2018 0600 Gross per 24 hour  Intake 499.06 ml  Output -  Net 499.06 ml      Physical Exam    General:  Well appearing. No resp difficulty HEENT: Normal Neck: Supple. JVP 7. Carotids 2+ bilat; no bruits. No lymphadenopathy or thyromegaly appreciated. Cor: PMI nondisplaced. Regular rate & rhythm. No rubs, gallops or murmurs. Lungs: Clear Abdomen: Soft, nontender, nondistended. No hepatosplenomegaly. No bruits or masses. Good bowel sounds. Extremities: No cyanosis, clubbing, rash, edema Neuro: Alert & orientedx3, cranial nerves grossly intact. moves all 4 extremities w/o difficulty. Affect pleasant   Telemetry   NSR, 70s (personally reviewed)   Labs    CBC Recent Labs    07/13/18 0422 07/14/18 0259  WBC 11.7* 10.8*  HGB 15.0 13.7  HCT 47.3 41.7  MCV 87.8 85.5  PLT 212 169   Basic Metabolic Panel Recent Labs    83/15/17 0422 07/14/18 0259  NA 139 138  K 3.3* 3.4*  CL 102 104  CO2 24 23  GLUCOSE 129* 111*  BUN 16 13  CREATININE 1.17 1.06  CALCIUM 9.4 9.0   Liver Function Tests No results for input(s): AST,  ALT, ALKPHOS, BILITOT, PROT, ALBUMIN in the last 72 hours. No results for input(s): LIPASE, AMYLASE in the last 72 hours. Cardiac Enzymes Recent Labs    07/13/18 0718 07/13/18 1438 07/13/18 1756  TROPONINI 5.52* >65.00* >65.00*    BNP: BNP (last 3 results) No results for input(s): BNP in the last 8760 hours.  ProBNP (last 3 results) No results for input(s): PROBNP in the last 8760 hours.   D-Dimer No results for input(s): DDIMER in the last 72 hours. Hemoglobin A1C No results for input(s): HGBA1C in the last 72 hours. Fasting Lipid Panel Recent Labs    07/14/18 0259  CHOL 221*  HDL 50  LDLCALC 157*  TRIG 68  CHOLHDL 4.4   Thyroid Function Tests No results for input(s): TSH, T4TOTAL, T3FREE, THYROIDAB in the last 72 hours.  Invalid input(s): FREET3  Other results:   Imaging    Ct Coronary Morph W/cta Cor W/score W/ca W/cm &/or Wo/cm  Addendum Date: 07/13/2018   ADDENDUM REPORT: 07/13/2018 18:01 CLINICAL DATA:  Chest pain EXAM: Cardiac CTA MEDICATIONS: Sub lingual nitro. 4mg  x 2 TECHNIQUE: The patient was scanned on a Siemens 192 slice scanner. Gantry rotation speed was 250 msecs. Collimation was 0.6 mm. A 100 kV prospective scan was triggered in the ascending thoracic aorta at 35-75% of the R-R interval. Average  HR during the scan was 60 bpm. The 3D data set was interpreted on a dedicated work station using MPR, MIP and VRT modes. A total of 80cc of contrast was used. FINDINGS: Non-cardiac: [facility]. Pulmonary veins drain normally to the left atrium. Coronary Arteries: Right dominant. LM: Large caliber left main, no plaque or stenosis. 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 The continuation of the LAD beyond the feeder vessel to the aneurysm is subtotally occluded, appears to be thrombotic. The LAD then recanalizes and continues to the apex. This portion of  the LAD is normal in caliber. The aneurysm gives rise to a large, tortuous vessel that eventually empties into the main pulmonary artery distal to the pulmonary valve. Circumflex system: No plaque or stenosis. RCA system: No plaque or stenosis. IMPRESSION: 1. 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 2. The mid LAD has a thrombotic occlusion just after it gives rise to the vessel that feeds the aneurysm. The LAD reconstitutes after this occlusion. There appears to be a CABG target. Dalton Mclean Electronically Signed   By: Marca Anconaalton  Mclean M.D.   On: 07/13/2018 18:01   Result Date: 07/13/2018 EXAM: OVER-READ INTERPRETATION  CT CHEST The following report is an over-read performed by radiologist Dr. Trudie Reedaniel Entrikin of [facility] on 07/13/2018. This over-read does not include interpretation of cardiac or coronary anatomy or pathology. The coronary calcium score/coronary CTA interpretation by the cardiologist is attached. COMPARISON:  None. FINDINGS: Within the visualized portions of the thorax there are no suspicious appearing pulmonary nodules or masses, there is no acute consolidative airspace disease, no pleural effusions, no pneumothorax and no lymphadenopathy. Visualized portions of the upper abdomen demonstrates a 1 cm low-attenuation lesion in segment 2 of the liver, compatible with a small simple cyst. There are no aggressive appearing lytic or blastic lesions noted in the visualized portions of the skeleton. IMPRESSION: 1. No significant incidental noncardiac findings are noted. Electronically Signed: By: Trudie Reedaniel  Entrikin M.D. On: 07/13/2018 13:58      Medications:     Scheduled Medications: . aspirin  81 mg Oral Daily  . aspirin EC  81 mg Oral Daily  . atorvastatin  80 mg Oral q1800  . sodium chloride flush  3 mL Intravenous Q12H  . spironolactone  12.5 mg Oral Daily     Infusions: . sodium chloride    .  heparin 1,000 Units/hr (07/14/18 0600)  . nitroGLYCERIN Stopped (07/13/18 0442)     PRN Medications:  sodium chloride, acetaminophen, morphine injection, nitroGLYCERIN, ondansetron (ZOFRAN) IV, sodium chloride flush   Assessment/Plan   1. CAD: Patient presented with anterior MI.  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  The continuation of the LAD beyond the branch to the aneurysm is more normal in caliber and was totally occluded on initial cath, no intervention.  We did a coronary CT 2/14, showing thrombotic occlusion of the mid LAD beyond the coronary=>PA fistula branch but recanalization of the more distal LAD likely by collateralization.  No chest pain now. TnI > 65.  - Continue ASA and statin.  - He is on heparin gtt with apical akinesis as well as aneurysmal LAD/saccular aneurysm leading to PA.  Will transition to warfarin eventually.  -  Will need evaluation for cardiac surgery => will need to see if the apical LV is viable, will arrange for cardiac MRI.  If viable territory, could consider ligation of coronary=>PA fistula and LIMA-LAD.  If not viable territory, not sure I would operate.  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  No symptoms prior to this MI.  3. Ischemic cardiomyopathy: EF 35-40% on echo. SBP in 90s currently, not volume overloaded on exam.   - Add spironolactone 12.5 mg daily.  - Eventual Coreg, ARB/ACEI/ARNI but BP too soft currently.  4. Mobilized, transfer to telemetry.   Length of Stay: 1  Marca Ancona, MD  07/14/2018, 8:23 AM  Advanced Heart Failure Team Pager [phone] (M-F; 7a - 4p)  Please contact [facility] for night-coverage after hours (4p -7a ) and weekends on [email or web address]

## 2018-07-15 DIAGNOSIS — I2510 Atherosclerotic heart disease of native coronary artery without angina pectoris: Secondary | ICD-10-CM

## 2018-07-15 LAB — CBC
HCT: 40 % (ref 39.0–52.0)
Hemoglobin: 13.1 g/dL (ref 13.0–17.0)
MCH: 28.3 pg (ref 26.0–34.0)
MCHC: 32.8 g/dL (ref 30.0–36.0)
MCV: 86.4 fL (ref 80.0–100.0)
Platelets: 160 10*3/uL (ref 150–400)
RBC: 4.63 MIL/uL (ref 4.22–5.81)
RDW: 12.9 % (ref 11.5–15.5)
WBC: 9.2 10*3/uL (ref 4.0–10.5)
nRBC: 0 % (ref 0.0–0.2)

## 2018-07-15 LAB — HEMOGLOBIN AND HEMATOCRIT, BLOOD
HCT: 42.3 % (ref 39.0–52.0)
Hemoglobin: 13.5 g/dL (ref 13.0–17.0)

## 2018-07-15 LAB — HEPARIN LEVEL (UNFRACTIONATED): Heparin Unfractionated: 0.44 IU/mL (ref 0.30–0.70)

## 2018-07-15 MED ORDER — METOPROLOL SUCCINATE ER 25 MG PO TB24
12.5000 mg | ORAL_TABLET | Freq: Every day | ORAL | Status: DC
  Administered 2018-07-15 – 2018-07-16 (×2): 12.5 mg via ORAL
  Filled 2018-07-15 (×2): qty 1

## 2018-07-15 MED ORDER — WARFARIN - PHARMACIST DOSING INPATIENT: Freq: Every day | Status: DC

## 2018-07-15 MED ORDER — METOPROLOL SUCCINATE ER 25 MG PO TB24: 12.5000 mg | ORAL_TABLET | Freq: Every day | ORAL | Status: DC

## 2018-07-15 MED ORDER — WARFARIN SODIUM 5 MG PO TABS
5.0000 mg | ORAL_TABLET | Freq: Once | ORAL | Status: AC
  Administered 2018-07-16: 5 mg via ORAL
  Filled 2018-07-15: qty 1

## 2018-07-15 NOTE — Progress Notes (Addendum)
   Called to see patient for R FA site pain. Patient does not speak English but seems to indicate increasing pain in R groin. Spoke with daughter.  His pain has been persistent since his cath but worse today. Also notes dizziness and BP has been s/w low.    EXAM: Ext: R FA site with mod sized hematoma; no bruit  PLAN:  Suspect he just has a hematoma. Will get repeat H/H now and in AM. Will get vascular US to r/o pseudoaneurysm. He is not really taking that much to lower BP.  BP has been soft the last couple of days. Monitor for now.  Tereso Newcomer, PA-C    07/15/2018 2:05 PM

## 2018-07-15 NOTE — Progress Notes (Signed)
ANTICOAGULATION CONSULT NOTE  Pharmacy Consult for Heparin/coumadin Indication: chest pain/ACS/apical akinesis as well as aneurysmal LAD  No Known Allergies  Patient Measurements: Weight: 153 lb 8 oz (69.6 kg) Heparin Dosing Weight: 77kg  Vital Signs: Temp: 98.3 F (36.8 C) (02/16 2153) Temp Source: Oral (02/16 2153) BP: 107/72 (02/16 2144) Pulse Rate: 74 (02/16 2153)  Labs: Recent Labs    07/13/18 0422 07/13/18 0718 07/13/18 1438 07/13/18 1756 07/13/18 2215 07/14/18 0259 07/15/18 0514 07/15/18 1433  HGB 15.0  --   --   --   --  13.7 13.1 13.5  HCT 47.3  --   --   --   --  41.7 40.0 42.3  PLT 212  --   --   --   --  169 160  --   LABPROT 12.9  --   --   --   --   --   --   --   INR 0.98  --   --   --   --   --   --   --   HEPARINUNFRC  --   --   --   --  0.34 0.46 0.44  --   CREATININE 1.17  --   --   --   --  1.06  --   --   TROPONINI  --  5.52* >65.00* >65.00*  --   --   --   --     CrCl cannot be calculated (Unknown ideal weight.).   Medical History: Past Medical History:  Diagnosis Date  . High cholesterol   . Renal cyst      Assessment: 75 yoM admitted as code STEMI. 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 Pharmacy consulted to begin IV heparin for medical management of ACS.  Heparin level still within goal range. Cardiology planning cardiac MRI to check viability for surgery. Will continue heparin for now, may transition to warfarin pending surgical decisions.   Hgb down from admit 15>13.1, plt stable, no bleeding documented.  Goal of Therapy:  Heparin level 0.3-0.7 units/ml Monitor platelets by anticoagulation protocol: Yes   Plan:  Continue heparin at 1000 units/hr Monitor daily heparin level and CBC, s/sx bleeding Plan is for warfarin eventually pending surgical decisions  Babs Bertin, PharmD, BCPS Please check AMION for all [facility] contact  numbers Clinical Pharmacist 07/15/2018 9:54 PM  Addendum  Pt will not benefit from CABG per CVTS. Coumadin has been ordered for anticoagulation. INR was 0.98 on 2/14. Currently on IV heparin.  Coumadin 5mg  PO x1  Daily INR  Ulyses Southward, PharmD, [place], AAHIVP, CPP Infectious Disease Pharmacist 07/15/2018 9:58 PM

## 2018-07-15 NOTE — Progress Notes (Signed)
Patient ID: Alvin Saracco, male   DOB: [birth year], 63 y.o.   MRN: [id number]     Advanced Heart Failure Rounding Note  PCP-Cardiologist: No primary care provider on file.   Subjective:    No chest pain.  SBP in 90s, was low prior to his MI also per his daughter.  Occasional mild lightheadedness with standing.    Echo: EF 35-40% with LAD territory WMAs, RV normal.    Objective:   Weight Range: 69.6 kg There is no height or weight on file to calculate BMI.   Vital Signs:   Temp:  [97.6 F (36.4 C)-98.4 F (36.9 C)] 98.4 F (36.9 C) (02/16 0527) Pulse Rate:  [62-71] 62 (02/16 0527) Resp:  [20] 20 (02/15 1703) BP: (97-106)/(51-62) 97/62 (02/16 0527) SpO2:  [94 %-96 %] 95 % (02/16 0527) Weight:  [69.6 kg] 69.6 kg (02/16 0529) Last BM Date: 07/15/18  Weight change: Filed Weights   07/13/18 0400 07/15/18 0529  Weight: 77.1 kg 69.6 kg    Intake/Output:   Intake/Output Summary (Last 24 hours) at 07/15/2018 1243 Last data filed at 07/15/2018 1015 Gross per 24 hour  Intake 599.63 ml  Output -  Net 599.63 ml      Physical Exam    General: NAD Neck: No JVD, no thyromegaly or thyroid nodule.  Lungs: Clear to auscultation bilaterally with normal respiratory effort. CV: Nondisplaced PMI.  Heart regular S1/S2, no S3/S4, no murmur.  No peripheral edema.  No carotid bruit.  Normal pedal pulses.  Abdomen: Soft, nontender, no hepatosplenomegaly, no distention.  Skin: Intact without lesions or rashes.  Neurologic: Alert and oriented x 3.  Psych: Normal affect. Extremities: No clubbing or cyanosis.  HEENT: Normal.    Telemetry   NSR, 70s (personally reviewed)   Labs    CBC Recent Labs    07/14/18 0259 07/15/18 0514  WBC 10.8* 9.2  HGB 13.7 13.1  HCT 41.7 40.0  MCV 85.5 86.4  PLT 169 160   Basic Metabolic Panel Recent Labs    83/38/25 0422 07/14/18 0259  NA 139 138  K 3.3* 3.4*  CL 102 104  CO2 24 23  GLUCOSE 129* 111*  BUN 16 13  CREATININE 1.17 1.06    CALCIUM 9.4 9.0   Liver Function Tests No results for input(s): AST, ALT, ALKPHOS, BILITOT, PROT, ALBUMIN in the last 72 hours. No results for input(s): LIPASE, AMYLASE in the last 72 hours. Cardiac Enzymes Recent Labs    07/13/18 0718 07/13/18 1438 07/13/18 1756  TROPONINI 5.52* >65.00* >65.00*    BNP: BNP (last 3 results) No results for input(s): BNP in the last 8760 hours.  ProBNP (last 3 results) No results for input(s): PROBNP in the last 8760 hours.   D-Dimer No results for input(s): DDIMER in the last 72 hours. Hemoglobin A1C No results for input(s): HGBA1C in the last 72 hours. Fasting Lipid Panel Recent Labs    07/14/18 0259  CHOL 221*  HDL 50  LDLCALC 157*  TRIG 68  CHOLHDL 4.4   Thyroid Function Tests No results for input(s): TSH, T4TOTAL, T3FREE, THYROIDAB in the last 72 hours.  Invalid input(s): FREET3  Other results:   Imaging    No results found.   Medications:     Scheduled Medications: . aspirin EC  81 mg Oral Daily  . atorvastatin  80 mg Oral q1800  . metoprolol succinate  12.5 mg Oral Daily  . sodium chloride flush  3 mL Intravenous Q12H  . spironolactone  12.5 mg Oral Daily    Infusions: . sodium chloride    . heparin 1,000 Units/hr (07/14/18 1818)  . nitroGLYCERIN Stopped (07/13/18 0442)    PRN Medications: sodium chloride, acetaminophen, morphine injection, nitroGLYCERIN, ondansetron (ZOFRAN) IV, sodium chloride flush   Assessment/Plan   1. CAD: Patient presented with anterior MI.  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  The continuation of the LAD beyond the branch to the aneurysm is more normal in caliber and was totally occluded on initial cath, no intervention.  We did a coronary CT 2/14, showing thrombotic occlusion of the mid LAD beyond the coronary=>PA fistula branch but recanalization of the more distal LAD likely by collateralization.   No chest pain now. TnI > 65.  - Continue ASA and statin.  - He is on heparin gtt with apical akinesis as well as aneurysmal LAD/saccular aneurysm leading to PA.  Will transition to warfarin eventually.  - Will need evaluation for cardiac surgery => will need to see if the apical LV is viable, will arrange for cardiac MRI.  If viable LAD territory, could consider ligation of coronary=>PA fistula and LIMA-LAD.  If not viable territory, not sure I would operate.  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  No symptoms prior to this MI.  3. Ischemic cardiomyopathy: EF 35-40% on echo. SBP in 90s currently, not volume overloaded on exam.   - Continue spironolactone.  - Add Toprol XL 12.5 mg qhs.   - Eventual ARB/ACEI/ARNI but BP too soft currently.  4. Continue to walk with cardiac rehab.    Length of Stay: 2  Marca Ancona, MD  07/15/2018, 12:43 PM  Advanced Heart Failure Team Pager [phone] (M-F; 7a - 4p)  Please contact [facility] for night-coverage after hours (4p -7a ) and weekends on [email or web address]

## 2018-07-15 NOTE — Progress Notes (Signed)
ANTICOAGULATION CONSULT NOTE  Pharmacy Consult for Heparin Indication: chest pain/ACS  No Known Allergies  Patient Measurements: Weight: 153 lb 8 oz (69.6 kg) Heparin Dosing Weight: 77kg  Vital Signs: Temp: 98.4 F (36.9 C) (02/16 0527) Temp Source: Oral (02/16 0527) BP: 97/62 (02/16 0527) Pulse Rate: 62 (02/16 0527)  Labs: Recent Labs    07/13/18 0422 07/13/18 0718 07/13/18 1438 07/13/18 1756 07/13/18 2215 07/14/18 0259 07/15/18 0514  HGB 15.0  --   --   --   --  13.7 13.1  HCT 47.3  --   --   --   --  41.7 40.0  PLT 212  --   --   --   --  169 160  LABPROT 12.9  --   --   --   --   --   --   INR 0.98  --   --   --   --   --   --   HEPARINUNFRC  --   --   --   --  0.34 0.46 0.44  CREATININE 1.17  --   --   --   --  1.06  --   TROPONINI  --  5.52* >65.00* >65.00*  --   --   --     CrCl cannot be calculated (Unknown ideal weight.).   Medical History: Past Medical History:  Diagnosis Date  . High cholesterol   . Renal cyst      Assessment: 50 yoM admitted as code STEMI. 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 Pharmacy consulted to begin IV heparin for medical management of ACS.  Heparin level still within goal range. Cardiology planning cardiac MRI to check viability for surgery. Will continue heparin for now, may transition to warfarin pending surgical decisions.   Hgb down from admit 15>13.1, plt stable, no bleeding documented.  Goal of Therapy:  Heparin level 0.3-0.7 units/ml Monitor platelets by anticoagulation protocol: Yes   Plan:  Continue heparin at 1000 units/hr Monitor daily heparin level and CBC, s/sx bleeding Plan is for warfarin eventually pending surgical decisions  Babs Bertin, PharmD, BCPS Please check AMION for all [facility] contact numbers Clinical Pharmacist 07/15/2018 10:21 AM

## 2018-07-15 NOTE — Progress Notes (Addendum)
2 Days Post-Op Procedure(s) (LRB): LEFT HEART CATH AND CORONARY ANGIOGRAPHY (N/A) Subjective: Patient examined, images of coronary angiogram, echocardiogram, and cardiac CT personally reviewed.  Situation discussed with patient's daughter.  63 year old previously healthy Asian male presented with acute anterior MI and chest pain.  Troponin greater than 65.  He was found to have high-grade stenosis of the mid LAD at the site of a 2 cm LAD aneurysm which then continued as a large anomalous coronary vessel to the main pulmonary artery-- coronary-PA fistula. The dominant RCA has no disease.  The circumflex has no disease.  There is no previous history of heart disease, heart failure, as the patient has had a fairly active lifestyle.  LVEDP was elevated at 26. Patient's blood pressure has been soft. Chest x-ray shows some interstitial edema.  With completed MI the patient would not benefit from early CABG and ligation of fistula and plication of aneurysm.  Agree with plan to proceed with viability study.  The anterior apical wall is probably nonviable.  If there is evidence that revascularization would help the patient then he would need an interval of recovery of 6 weeks and repeat echo with right heart cath prior to surgery.  If surgery would not be  beneficial then long-term oral anticoagulation and medications for LV dysfunction would be his best treatment. Objective: Vital signs in last 24 hours: Temp:  [97.6 F (36.4 C)-98.9 F (37.2 C)] 98.9 F (37.2 C) (02/16 1347) Pulse Rate:  [62-71] 69 (02/16 1347) Cardiac Rhythm: Normal sinus rhythm (02/16 0943) Resp:  [20] 20 (02/15 1703) BP: (90-106)/(51-62) 90/55 (02/16 1347) SpO2:  [94 %-96 %] 96 % (02/16 1347) Weight:  [69.6 kg] 69.6 kg (02/16 0529)  Hemodynamic parameters for last 24 hours:    Intake/Output from previous day: 02/15 0701 - 02/16 0700 In: 836.8 [P.O.:600; I.V.:236.8] Out: -  Intake/Output this shift: Total I/O In: 42.1  [I.V.:42.1] Out: -   Exam Small Asian male no acute distress No JVD Lungs clear Soft bruit over chest Small hematoma that is tender in right groin No peripheral edema Neuro intact  Lab Results: Recent Labs    07/14/18 0259 07/15/18 0514 07/15/18 1433  WBC 10.8* 9.2  --   HGB 13.7 13.1 13.5  HCT 41.7 40.0 42.3  PLT 169 160  --    BMET:  Recent Labs    07/13/18 0422 07/14/18 0259  NA 139 138  K 3.3* 3.4*  CL 102 104  CO2 24 23  GLUCOSE 129* 111*  BUN 16 13  CREATININE 1.17 1.06  CALCIUM 9.4 9.0    PT/INR:  Recent Labs    07/13/18 0422  LABPROT 12.9  INR 0.98   ABG No results found for: PHART, HCO3, TCO2, ACIDBASEDEF, O2SAT CBG (last 3)  No results for input(s): GLUCAP in the last 72 hours.  Assessment/Plan: S/P Procedure(s) (LRB): LEFT HEART CATH AND CORONARY ANGIOGRAPHY (N/A) Completed acute MI, severe Patient would not benefit from early CABG and may not need CABG if viability study shows scar in the anterior apical region. We will follow.  LOS: 2 days    Kathlee Nations Trigt III 07/15/2018

## 2018-07-16 ENCOUNTER — Inpatient Hospital Stay (HOSPITAL_COMMUNITY): Payer: Self-pay

## 2018-07-16 DIAGNOSIS — I255 Ischemic cardiomyopathy: Secondary | ICD-10-CM

## 2018-07-16 LAB — CBC
HCT: 41.3 % (ref 39.0–52.0)
Hemoglobin: 13.6 g/dL (ref 13.0–17.0)
MCH: 28.8 pg (ref 26.0–34.0)
MCHC: 32.9 g/dL (ref 30.0–36.0)
MCV: 87.3 fL (ref 80.0–100.0)
NRBC: 0 % (ref 0.0–0.2)
Platelets: 166 10*3/uL (ref 150–400)
RBC: 4.73 MIL/uL (ref 4.22–5.81)
RDW: 12.7 % (ref 11.5–15.5)
WBC: 11.5 10*3/uL — ABNORMAL HIGH (ref 4.0–10.5)

## 2018-07-16 LAB — HEPARIN LEVEL (UNFRACTIONATED): Heparin Unfractionated: 0.37 IU/mL (ref 0.30–0.70)

## 2018-07-16 LAB — BASIC METABOLIC PANEL
Anion gap: 8 (ref 5–15)
BUN: 14 mg/dL (ref 8–23)
CO2: 27 mmol/L (ref 22–32)
Calcium: 9.2 mg/dL (ref 8.9–10.3)
Chloride: 104 mmol/L (ref 98–111)
Creatinine, Ser: 1.16 mg/dL (ref 0.61–1.24)
GFR calc Af Amer: >60 mL/min (ref >60)
Glucose, Bld: 117 mg/dL — ABNORMAL HIGH (ref 70–99)
POTASSIUM: 4.5 mmol/L (ref 3.5–5.1)
Sodium: 139 mmol/L (ref 135–145)

## 2018-07-16 LAB — PROTIME-INR
INR: 1.06
Prothrombin Time: 13.7 seconds (ref 11.4–15.2)

## 2018-07-16 MED ORDER — WARFARIN SODIUM 7.5 MG PO TABS
7.5000 mg | ORAL_TABLET | Freq: Once | ORAL | Status: AC
  Administered 2018-07-16: 7.5 mg via ORAL
  Filled 2018-07-16: qty 1

## 2018-07-16 MED ORDER — GADOBUTROL 1 MMOL/ML IV SOLN
7.5000 mL | Freq: Once | INTRAVENOUS | Status: AC | PRN
  Administered 2018-07-16: 7.5 mL via INTRAVENOUS

## 2018-07-16 MED ORDER — LISINOPRIL 2.5 MG PO TABS
2.5000 mg | ORAL_TABLET | Freq: Every day | ORAL | Status: DC
  Administered 2018-07-16 – 2018-07-17 (×2): 2.5 mg via ORAL
  Filled 2018-07-16 (×2): qty 1

## 2018-07-16 NOTE — Plan of Care (Signed)
  Problem: Clinical Measurements: Goal: Will remain free from infection Outcome: Progressing Note:  No s/s of infection noted. Goal: Respiratory complications will improve Outcome: Progressing Note:  No s/s of respiratory complications noted.  Stable on room air.   Problem: Coping: Goal: Level of anxiety will decrease Outcome: Progressing Note:  No s/s of anxiety noted.  Calm and cooperative.

## 2018-07-16 NOTE — Progress Notes (Signed)
ANTICOAGULATION CONSULT NOTE  Pharmacy Consult for Heparin/coumadin Indication: chest pain/ACS/apical akinesis as well as aneurysmal LAD  No Known Allergies  Patient Measurements: Weight: 152 lb 12.8 oz (69.3 kg) Heparin Dosing Weight: 77kg  Vital Signs: Temp: 98.7 F (37.1 C) (02/17 0558) Temp Source: Oral (02/17 0558) BP: 92/64 (02/17 0558) Pulse Rate: 68 (02/17 0558)  Labs: Recent Labs    07/13/18 1438 07/13/18 1756  07/14/18 0259 07/15/18 0514 07/15/18 1433 07/16/18 0730  HGB  --   --    < > 13.7 13.1 13.5 13.6  HCT  --   --    < > 41.7 40.0 42.3 41.3  PLT  --   --   --  169 160  --  166  HEPARINUNFRC  --   --    < > 0.46 0.44  --  0.37  CREATININE  --   --   --  1.06  --   --  1.16  TROPONINI >65.00* >65.00*  --   --   --   --   --    < > = values in this interval not displayed.    CrCl cannot be calculated (Unknown ideal weight.).   Medical History: Past Medical History:  Diagnosis Date  . High cholesterol   . Renal cyst      Assessment: Bryan Vazquez admitted as code STEMI. 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 Coronary CT shows thrombotic occlusion within the vessel.  Pharmacy consulted for heparin and warfarin dosing. Heparin remains therapeutic on 1000 units/h, INR 1.06 and subtherapeutic as expected.  Goal of Therapy:  Heparin level 0.3-0.7 units/ml Monitor platelets by anticoagulation protocol: Yes   Plan:  -Continue heparin at 1000 units/hr -Warfarin 7.5mg  x1 tonight -Daily INR, heparin level, and CBC   Fredonia Highland, PharmD, BCPS Clinical Pharmacist [phone] Please check AMION for all [facility] numbers 07/16/2018

## 2018-07-16 NOTE — Discharge Instructions (Signed)

## 2018-07-16 NOTE — Progress Notes (Addendum)
Patient ID: Bryan Vazquez, male   DOB: [birth year], 63 y.o.   MRN: [id number]     Advanced Heart Failure Rounding Note  PCP-Cardiologist: No primary care provider on file.   Subjective:    Yesterday metoprolol was added at bedtime.   Denies chest pain. Denies dizziness.  Echo: EF 35-40% with LAD territory WMAs, RV normal.    Objective:   Weight Range: 69.3 kg There is no height or weight on file to calculate BMI.   Vital Signs:   Temp:  [98.3 F (36.8 C)-98.9 F (37.2 C)] 98.7 F (37.1 C) (02/17 0558) Pulse Rate:  [68-77] 68 (02/17 0558) BP: (90-107)/(55-72) 92/64 (02/17 0558) SpO2:  [94 %-96 %] 94 % (02/17 0558) Weight:  [69.3 kg] 69.3 kg (02/17 0558) Last BM Date: 07/15/18  Weight change: Filed Weights   07/13/18 0400 07/15/18 0529 07/16/18 0558  Weight: 77.1 kg 69.6 kg 69.3 kg    Intake/Output:   Intake/Output Summary (Last 24 hours) at 07/16/2018 0748 Last data filed at 07/15/2018 1632 Gross per 24 hour  Intake 104.74 ml  Output -  Net 104.74 ml      Physical Exam    General:  In bed. Noresp difficulty HEENT: normal Neck: supple. no JVD. Carotids 2+ bilat; no bruits. No lymphadenopathy or thryomegaly appreciated. Cor: PMI nondisplaced. Regular rate & rhythm. No rubs, gallops or murmurs. Lungs: clear Abdomen: soft, nontender, nondistended. No hepatosplenomegaly. No bruits or masses. Good bowel sounds. Extremities: no cyanosis, clubbing, rash, edema Neuro: alert & orientedx3, cranial nerves grossly intact. moves all 4 extremities w/o difficulty. Affect pleasant    Telemetry   NSR 70 personally reviewed.    Labs    CBC Recent Labs    07/14/18 0259 07/15/18 0514 07/15/18 1433  WBC 10.8* 9.2  --   HGB 13.7 13.1 13.5  HCT 41.7 40.0 42.3  MCV 85.5 86.4  --   PLT 169 160  --    Basic Metabolic Panel Recent Labs    47/42/59 0259  NA 138  K 3.4*  CL 104  CO2 23  GLUCOSE 111*  BUN 13  CREATININE 1.06  CALCIUM 9.0   Liver Function  Tests No results for input(s): AST, ALT, ALKPHOS, BILITOT, PROT, ALBUMIN in the last 72 hours. No results for input(s): LIPASE, AMYLASE in the last 72 hours. Cardiac Enzymes Recent Labs    07/13/18 1438 07/13/18 1756  TROPONINI >65.00* >65.00*    BNP: BNP (last 3 results) No results for input(s): BNP in the last 8760 hours.  ProBNP (last 3 results) No results for input(s): PROBNP in the last 8760 hours.   D-Dimer No results for input(s): DDIMER in the last 72 hours. Hemoglobin A1C No results for input(s): HGBA1C in the last 72 hours. Fasting Lipid Panel Recent Labs    07/14/18 0259  CHOL 221*  HDL 50  LDLCALC 157*  TRIG 68  CHOLHDL 4.4   Thyroid Function Tests No results for input(s): TSH, T4TOTAL, T3FREE, THYROIDAB in the last 72 hours.  Invalid input(s): FREET3  Other results:   Imaging    No results found.   Medications:     Scheduled Medications: . aspirin EC  81 mg Oral Daily  . atorvastatin  80 mg Oral q1800  . metoprolol succinate  12.5 mg Oral QHS  . sodium chloride flush  3 mL Intravenous Q12H  . spironolactone  12.5 mg Oral Daily  . Warfarin - Pharmacist Dosing Inpatient   Does not apply [phone]  Infusions: . sodium chloride    . heparin 1,000 Units/hr (07/15/18 1811)  . nitroGLYCERIN Stopped (07/13/18 0442)    PRN Medications: sodium chloride, acetaminophen, morphine injection, nitroGLYCERIN, ondansetron (ZOFRAN) IV, sodium chloride flush   Assessment/Plan   1. CAD: Patient presented with anterior MI.  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  The continuation of the LAD beyond the branch to the aneurysm is more normal in caliber and was totally occluded on initial cath, no intervention.  We did a coronary CT 2/14, showing thrombotic occlusion of the mid LAD beyond the coronary=>PA fistula branch but recanalization of the more distal LAD likely by  collateralization.  No chest pain now. TnI > 65.  - Continue ASA and statin.  - He is on heparin gtt with apical akinesis as well as aneurysmal LAD/saccular aneurysm leading to PA.  Will transition to warfarin eventually.  - Will need to see if the apical LV is viable, will arrange for cardiac MRI.  If viable LAD territory, could consider ligation of coronary=>PA fistula and LIMA-LAD.  If not viable territory, not sure I would operate.  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 He was seen by Dr Donata Clay who suggested waiting 6 wks prior to CABG-fistula ligation if LAD territory is viable and no operation if not.  - BMET pending.  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  No symptoms prior to this MI.  3. Ischemic cardiomyopathy: EF 35-40% on echo. SBP reamins soft. No room to titrate medications. BMET pending.  - Continue spironolactone.  - Continue Toprol XL 12.5 mg qhs.   - Eventual ARB/ACEI/ARNI but BP too soft currently.  4. Continue to walk with cardiac rehab.    Length of Stay: 3  Amy Clegg, NP  07/16/2018, 7:48 AM  Advanced Heart Failure Team Pager [phone] (M-F; 7a - 4p)  Please contact [facility] for night-coverage after hours (4p -7a ) and weekends on [email or web address]  Patient seen with NP, agree with the above note.   Slight chest tightness ("1/10") with vigorous walking in hall yesterday.  Feel ok this morning.  Very mild lightheadedness with standing.  No dysppnea.   On exam, no JVD.  Clear lungs.  Regular S1S2.  No S3/S4.  No edema.   Plan for cardiac MRI today => if viable LAD territory, would plan for 6 weeks recovery then LIMA-LAD + coronary-PA ligation.  If apical LV not viable, no surgery.    Stable clinically.  Continue slow medication titration => continue low doses of spironolactone and Toprol XL, add low dose lisinopril 2.5 mg daily.   Continue statin.  He  is now on heparin bridging to therapeutic warfarin given concern for thrombosis risk in large, aneurysmal LAD and LAD-PA fistula.  When he goes home, will need Lovenox.  Will try to arrange.   Probably home in am.   Marca Ancona 07/16/2018 9:38 AM

## 2018-07-16 NOTE — Progress Notes (Signed)
CARDIAC REHAB PHASE I   PRE:  Rate/Rhythm: 81 SR    BP: sitting 112/72    SaO2:   MODE:  Ambulation: 940 ft   POST:  Rate/Rhythm: 97 SR    BP: lying 112/73     SaO2:   Pt sts (daughter translating) he had 1/10 CP yesterday after walking 20 min in room. Resolved after 2 hours. No CP today walking however did c/o nausea and lightheadedness (didn't tell me till after walk). This continued with lying down, BP seems stable. Gave pt and family MI book and videos to watch on MI, Coumadin, and HF. Daughter, Marla Roe, signed waiver to translate. Will f/u tomorrow to ed (going for MRI). [phone]  Harriet Masson CES, ACSM 07/16/2018 9:33 AM

## 2018-07-16 NOTE — Progress Notes (Signed)
Patient states right groin hematoma has increased in size since yesterday, and bruising is worse.  Golf ball sized hematoma noted at right groin cath site, surrounded by ecchymosis.  Patient reports 2/10 pain in right groin and states the pain is better than yesterday.  Cardiology fellow, Dr. Daphine Deutscher, notified of the above.  Vascular ultrasound to be performed in am.  No new orders received.  H&H stable.  VSS.  Patient in NAD.  Will continue to monitor. Bryan Vazquez

## 2018-07-16 NOTE — Progress Notes (Signed)
   F/U made Coumadin clinic 2/21. [employer]   If he cant afford lovenox will need HF fund until INR therapeutic.   Georgeanne Frankland NP-C  9:56 AM

## 2018-07-17 ENCOUNTER — Inpatient Hospital Stay (HOSPITAL_COMMUNITY): Payer: Self-pay

## 2018-07-17 DIAGNOSIS — I724 Aneurysm of artery of lower extremity: Secondary | ICD-10-CM

## 2018-07-17 LAB — BASIC METABOLIC PANEL
ANION GAP: 10 (ref 5–15)
BUN: 19 mg/dL (ref 8–23)
CO2: 23 mmol/L (ref 22–32)
Calcium: 9 mg/dL (ref 8.9–10.3)
Chloride: 104 mmol/L (ref 98–111)
Creatinine, Ser: 1.15 mg/dL (ref 0.61–1.24)
GFR calc non Af Amer: >60 mL/min (ref >60)
Glucose, Bld: 107 mg/dL — ABNORMAL HIGH (ref 70–99)
Potassium: 4.4 mmol/L (ref 3.5–5.1)
Sodium: 137 mmol/L (ref 135–145)

## 2018-07-17 LAB — CBC
HCT: 38.3 % — ABNORMAL LOW (ref 39.0–52.0)
Hemoglobin: 12.4 g/dL — ABNORMAL LOW (ref 13.0–17.0)
MCH: 28.4 pg (ref 26.0–34.0)
MCHC: 32.4 g/dL (ref 30.0–36.0)
MCV: 87.6 fL (ref 80.0–100.0)
Platelets: 153 10*3/uL (ref 150–400)
RBC: 4.37 MIL/uL (ref 4.22–5.81)
RDW: 12.8 % (ref 11.5–15.5)
WBC: 9 10*3/uL (ref 4.0–10.5)
nRBC: 0 % (ref 0.0–0.2)

## 2018-07-17 LAB — PROTIME-INR
INR: 1.27
Prothrombin Time: 15.8 seconds — ABNORMAL HIGH (ref 11.4–15.2)

## 2018-07-17 LAB — HEPARIN LEVEL (UNFRACTIONATED): Heparin Unfractionated: 0.31 IU/mL (ref 0.30–0.70)

## 2018-07-17 MED ORDER — NITROGLYCERIN 0.4 MG SL SUBL: 0.4000 mg | SUBLINGUAL_TABLET | SUBLINGUAL | 12 refills | Status: AC | PRN

## 2018-07-17 MED ORDER — ENOXAPARIN SODIUM 80 MG/0.8ML ~~LOC~~ SOLN: 80.0000 mg | Freq: Two times a day (BID) | SUBCUTANEOUS | 0 refills | Status: DC

## 2018-07-17 MED ORDER — METOPROLOL SUCCINATE ER 25 MG PO TB24: 25.0000 mg | ORAL_TABLET | Freq: Every day | ORAL | Status: DC

## 2018-07-17 MED ORDER — ASPIRIN 81 MG PO TBEC: 81.0000 mg | DELAYED_RELEASE_TABLET | Freq: Every day | ORAL | 3 refills | Status: AC

## 2018-07-17 MED ORDER — WARFARIN SODIUM 7.5 MG PO TABS: 7.5000 mg | ORAL_TABLET | Freq: Once | ORAL | Status: DC

## 2018-07-17 MED ORDER — SPIRONOLACTONE 25 MG PO TABS: 12.5000 mg | ORAL_TABLET | Freq: Every day | ORAL | Status: DC

## 2018-07-17 MED ORDER — ASPIRIN 81 MG PO TBEC: 81.0000 mg | DELAYED_RELEASE_TABLET | Freq: Every day | ORAL | Status: DC

## 2018-07-17 MED ORDER — ACETAMINOPHEN 325 MG PO TABS: 650.0000 mg | ORAL_TABLET | ORAL | Status: AC | PRN

## 2018-07-17 MED ORDER — LISINOPRIL 2.5 MG PO TABS: 2.5000 mg | ORAL_TABLET | Freq: Every day | ORAL | Status: DC

## 2018-07-17 MED ORDER — WARFARIN SODIUM 5 MG PO TABS: ORAL_TABLET | ORAL | Status: DC

## 2018-07-17 MED ORDER — ATORVASTATIN CALCIUM 80 MG PO TABS: 80.0000 mg | ORAL_TABLET | Freq: Every day | ORAL | Status: DC

## 2018-07-17 MED FILL — SPIRONOLACTONE 25 MG TABLET: 25 | 34 days supply | Qty: 17 | Fill #0

## 2018-07-17 MED FILL — SHARPS COLLECTOR 1.4QT: 30 days supply | Qty: 1 | Fill #0

## 2018-07-17 MED FILL — ATORVASTATIN 80 MG TABLET: 80 | 34 days supply | Qty: 34 | Fill #0

## 2018-07-17 MED FILL — ENOXAPARIN 80 MG/0.8 ML SYR: 80 | 7 days supply | Qty: 11 | Fill #0

## 2018-07-17 MED FILL — METOPROLOL SUCCINATE ER 25: 25 | 34 days supply | Qty: 34 | Fill #0

## 2018-07-17 MED FILL — NITROGLYCERIN 0.4 MG TAB SL: 0.4 | 13 days supply | Qty: 25 | Fill #0

## 2018-07-17 MED FILL — WARFARIN SODIUM 5 MG TABLET: 5 | 34 days supply | Qty: 51 | Fill #0

## 2018-07-17 MED FILL — LISINOPRIL 2.5 MG TABLET: 2.5 | 34 days supply | Qty: 34 | Fill #0

## 2018-07-17 NOTE — Progress Notes (Signed)
Right groin limited arterial duplex       has been completed. Preliminary results can be found under CV proc through chart review. Jeb Levering, BS, RDMS, RVT    Of note, original order was incorrectly entered on 2/16 and vascular lab was not aware of that order.

## 2018-07-17 NOTE — Progress Notes (Signed)
ANTICOAGULATION CONSULT NOTE  Pharmacy Consult for Coumadin Indication: chest pain/ACS/apical akinesis as well as aneurysmal LAD  No Known Allergies  Patient Measurements: Weight: 152 lb 8 oz (69.2 kg) Heparin Dosing Weight: 77kg  Vital Signs: Temp: 98.8 F (37.1 C) (02/18 1200) Temp Source: Oral (02/18 1200) BP: 100/62 (02/18 1200) Pulse Rate: 60 (02/18 1200)  Labs: Recent Labs    07/15/18 0514 07/15/18 1433 07/16/18 0730 07/16/18 1150 07/17/18 0408  HGB 13.1 13.5 13.6  --  12.4*  HCT 40.0 42.3 41.3  --  38.3*  PLT 160  --  166  --  153  LABPROT  --   --   --  13.7 15.8*  INR  --   --   --  1.06 1.27  HEPARINUNFRC 0.44  --  0.37  --  0.31  CREATININE  --   --  1.16  --  1.15    CrCl cannot be calculated (Unknown ideal weight.).   Medical History: Past Medical History:  Diagnosis Date  . High cholesterol   . Renal cyst     Assessment: 58 yoM admitted as code STEMI. 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 Coronary CT shows thrombotic occlusion within the vessel.  Heparin gtt stopped this AM for increased bruising in R groin.  Continuing warfarin.  Goal of Therapy:  INR 2-3 Monitor platelets by anticoagulation protocol: Yes   Plan:  -Warfarin 7.5mg  x1 again tonight -Daily INR -Planning to initiate Lovenox bridge tomorrow AM.     Reece Leader, Loura Back, BCPS, BCCP Clinical Pharmacist Phone [phone]  07/17/2018 1:04 PM

## 2018-07-17 NOTE — Discharge Summary (Signed)
Advanced Heart Failure Discharge Note  Discharge Summary   Patient ID: Ricahrd Nicoloff MRN: [id number], DOB/AGE: [birth year] 63 y.o. Admit date: 07/13/2018 D/C date:     07/17/2018   Primary Discharge Diagnoses:  1. CAD s/p STEMI 2. Coronary=>PA fistula:  3. Ischemic cardiomyopathy: EF 35-40% on echo, 37% on cardiac MRI.   Hospital Course:   Maxmillian Hochstein is a 63 y.o. male with history of HLD who presented with chest pain. Found to have STEMI and taken emergently to cath lab which showed subtotally occluded mid to distal LAD arising from a large fistulous segment in the mid LAD which appeared to connected with the pulmonary artery. No "culprit lesion" noted, so medical therapy recommended.    Pt went for CTA which confirmed the LAD gives rise to a large saccular aneurysm that continues into a large, tortuous LAD => main PA fistula.   CABG target noted as below, though cMRI showed LVEF 37%, and RVEF 43% and poor viability to affected area. Deemed not to be CABG candidate. No plan to intervene on fistula at this time as patient was very active and relatively asymptomatic prior to admission.   Hospital course complicated by questionable altered level of consciousness 2/18. Pt reported his head feeling "tight" and like he was going "in and out of consciousness". Initially accompanied by chest pain, but resolved with out intervention. Head CT unremarkable. Thought to possibly be due to stress/anxiety.  Pt also had bruising around his groin cath site. [place] negative for pseudoaneurysm.   He was examined pm of 07/17/2018 and determined to be stable for discharge home after discussion with him and his daughter. He plans to remain in the [place] for approx 1 more month, then return home to [place] to seek further surgical intervention if warranted. Pt given CDs with imaging studies prior to discharge.   Discharge Weight Range: 152.5 lbs Discharge Vitals: Blood pressure 98/60, pulse 62, temperature (!) 97.5 F (36.4  C), temperature source Oral, resp. rate 18, weight 69.2 kg, SpO2 95 %.  Labs: Lab Results  Component Value Date   WBC 9.0 07/17/2018   HGB 12.4 (L) 07/17/2018   HCT 38.3 (L) 07/17/2018   MCV 87.6 07/17/2018   PLT 153 07/17/2018    Recent Labs  Lab 07/17/18 0408  NA 137  K 4.4  CL 104  CO2 23  BUN 19  CREATININE 1.15  CALCIUM 9.0  GLUCOSE 107*   Lab Results  Component Value Date   CHOL 221 (H) 07/14/2018   HDL 50 07/14/2018   LDLCALC 157 (H) 07/14/2018   TRIG 68 07/14/2018   BNP (last 3 results) No results for input(s): BNP in the last 8760 hours.  ProBNP (last 3 results) No results for input(s): PROBNP in the last 8760 hours.  Diagnostic Studies/Procedures   Ct Head Wo Contrast  Result Date: 07/17/2018 CLINICAL DATA:  Onset slurred speech today. EXAM: CT HEAD WITHOUT CONTRAST TECHNIQUE: Contiguous axial images were obtained from the base of the skull through the vertex without intravenous contrast. COMPARISON:  None. FINDINGS: Brain: No evidence of acute infarction, hemorrhage, hydrocephalus, extra-axial collection or mass lesion/mass effect. Vascular: No hyperdense vessel or unexpected calcification. Skull: Intact.  No focal lesion. Sinuses/Orbits: Very small mucous retention cyst or polyp left maxillary sinus noted. Other: None. IMPRESSION: Negative head CT. Electronically Signed   By: Drusilla Kanner M.D.   On: 07/17/2018 11:44   Mr Cardiac Morphology W Wo Contrast  Result Date: 07/16/2018 CLINICAL DATA:  Ischemic cardiomyopathy,  assess viability. EXAM: CARDIAC MRI TECHNIQUE: The patient was scanned on a 1.5 Tesla GE magnet. A dedicated cardiac coil was used. Functional imaging was done using Fiesta sequences. 2,3, and 4 chamber views were done to assess for RWMA's. Modified Simpson's rule using a short axis stack was used to calculate an ejection fraction on a dedicated work Research officer, trade unionstation using Circle software. The patient received 7.5 cc of Gadavist. After 10 minutes  inversion recovery sequences were used to assess for infiltration and scar tissue. CONTRAST:  7.5 cc Gadavist FINDINGS: Limited images of the lung fields showed no gross abnormalities. There was a small pericardial effusion. Normal left ventricular size and wall thickness. There was akinesis of the mid-apical anterior, anteroseptal and inferoseptal wall segments, the apical inferior wall, and the true apex. Moderate systolic dysfunction, EF 37%. No LV thrombus was seen. Normal right ventricular size with borderline decreased systolic function, EF 43%. Mildly dialted left atrium. Normal right atrial size. Trileaflet aortic valve with no regurgitation or stenosis. No significant mitral regurgitation. Delayed enhancement imaging showed 76-99% wall thickness subendocardial late gadolinium enhancement in the mid-apical anterior, anteroseptal and inferoseptal wall segments, the apical inferior wall, and the true apex. There was no-reflow in some areas. Measurements: LVEDV 166 mL LVSV 62 mL LVEF 37% RVEDV 81 mL RVSV 62 mL RVEF 43% IMPRESSION: 1. Normal LV size with wall motion abnormalities in an LAD infarct pattern, EF 37%. 2. Normal RV size with borderline decreased systolic function, EF 43%. 3. Delayed enhancement images suggest that the akinetic wall segments have significant coronary-pattern scar present and are unlikely to improve with revascularization. Dalton Mclean Electronically Signed   By: Marca Anconaalton  Mclean M.D.   On: 07/16/2018 15:18   Vas [place] Lower Extremity Arterial Duplex  Result Date: 07/17/2018 LOWER EXTREMITY ARTERIAL DUPLEX STUDY  Current ABI: N/A Performing Technologist: Jeb LeveringJill Parker RDMS, RVT  Examination Guidelines: A complete evaluation includes B-mode imaging, spectral Doppler, color Doppler, and power Doppler as needed of all accessible portions of each vessel. Bilateral testing is considered an integral part of a complete examination. Limited examinations for reoccurring indications may be  performed as noted.  Right Duplex Findings: +--------+--------+-----+--------+---------+--------+         PSV cm/sRatioStenosisWaveform Comments +--------+--------+-----+--------+---------+--------+ CFA [id number]                  triphasic         +--------+--------+-----+--------+---------+--------+ Patent CFV.  Summary: Right: No evidence of pseudoaneurysm or AV fistula right groin.  See table(s) above for measurements and observations.    Preliminary    Echo 07/13/18 EF 35-40% with LAD territory WMAs, RV normal.       Cardiac MRI 07/16/18 EF 37% with wall motion abnormalities c/w LAD infarct, normal RV.  The apical LV does not appear viable.   Discharge Medications   Allergies as of 07/17/2018   No Known Allergies     Medication List    TAKE these medications   acetaminophen 325 MG tablet Commonly known as:  TYLENOL Take 2 tablets (650 mg total) by mouth every 4 (four) hours as needed for headache or mild pain.   aspirin 81 MG EC tablet Take 1 tablet (81 mg total) by mouth daily. Start taking on:  July 18, 2018   atorvastatin 80 MG tablet Commonly known as:  LIPITOR Take 1 tablet (80 mg total) by mouth daily at 6 PM.   enoxaparin 80 MG/0.8ML injection Commonly known as:  LOVENOX Inject 0.8 mLs (80 mg total) into  the skin 2 (two) times daily for 7 days. Start taking on:  July 18, 2018   lisinopril 2.5 MG tablet Commonly known as:  PRINIVIL,ZESTRIL Take 1 tablet (2.5 mg total) by mouth daily. Start taking on:  July 18, 2018   metoprolol succinate 25 MG 24 hr tablet Commonly known as:  TOPROL-XL Take 1 tablet (25 mg total) by mouth at bedtime.   nitroGLYCERIN 0.4 MG SL tablet Commonly known as:  NITROSTAT Place 1 tablet (0.4 mg total) under the tongue every 5 (five) minutes x 3 doses as needed for chest pain.   spironolactone 25 MG tablet Commonly known as:  ALDACTONE Take 0.5 tablets (12.5 mg total) by mouth daily. Start taking on:  July 18, 2018   warfarin 5 MG tablet Commonly known as:  COUMADIN Take 7.5 mg (One and one half tablets) daily at 6 pm.      Disposition   The patient will be discharged in stable condition to home. Discharge Instructions    (HEART FAILURE PATIENTS) Call MD:  Anytime you have any of the following symptoms: 1) 3 pound weight gain in 24 hours or 5 pounds in 1 week 2) shortness of breath, with or without a dry hacking cough 3) swelling in the hands, feet or stomach 4) if you have to sleep on extra pillows at night in order to breathe.   Complete by:  As directed    Amb Referral to Cardiac Rehabilitation   Complete by:  As directed    Diagnosis:  STEMI   Diet - low sodium heart healthy   Complete by:  As directed    Increase activity slowly   Complete by:  As directed    STOP any activity that causes chest pain, shortness of breath, dizziness, sweating, or exessive weakness   Complete by:  As directed      Follow-up Information    [facility] Follow up on 07/20/2018.   Specialty:  Cardiology Why:  at 230 pm for New Coumadin appointment.  Contact information: [address] [place] [address] [phone]       [facility] Follow up on 07/31/2018.   Specialty:  Cardiology Why:  at 320 pm for post hospital follow up. The code for parking is 8008 Contact information: [address] [id number] [place]he[place] Bon[address] [place] [address] [phone]           Duration of Discharge Encounter: Greater than 35 minutes   Signed, Luane School 07/17/2018, 3:02 PM

## 2018-07-17 NOTE — Progress Notes (Signed)
Patient ID: Bryan Vazquez, male   DOB: January 16, 1956, 63 y.o.   MRN: [id number]     Advanced Heart Failure Rounding Note  PCP-Cardiologist: No primary care provider on file.   Subjective:    No chest pain or dyspnea.  Mild lightheadedness with standing.  Has right groin cath site small hematoma/ecchymosis, on heparin gtt.  Hgb fairly stable at 12.4.   Echo: EF 35-40% with LAD territory WMAs, RV normal.   Cardiac MRI: EF 37% with wall motion abnormalities c/w LAD infarct, normal RV.  The apical LV does not appear viable.    Objective:   Weight Range: 69.2 kg There is no height or weight on file to calculate BMI.   Vital Signs:   Temp:  [98.2 F (36.8 C)-98.5 F (36.9 C)] 98.2 F (36.8 C) (02/18 0541) Pulse Rate:  [54-70] 54 (02/18 0541) Resp:  [20] 20 (02/17 2020) BP: (91-102)/(52-77) 95/57 (02/18 0541) SpO2:  [94 %-97 %] 94 % (02/18 0541) Weight:  [69.2 kg] 69.2 kg (02/18 0541) Last BM Date: 07/15/18  Weight change: Filed Weights   07/15/18 0529 07/16/18 0558 07/17/18 0541  Weight: 69.6 kg 69.3 kg 69.2 kg    Intake/Output:   Intake/Output Summary (Last 24 hours) at 07/17/2018 0821 Last data filed at 07/17/2018 0656 Gross per 24 hour  Intake 728.81 ml  Output -  Net 728.81 ml      Physical Exam    General: NAD Neck: No JVD, no thyromegaly or thyroid nodule.  Lungs: Clear to auscultation bilaterally with normal respiratory effort. CV: Nondisplaced PMI.  Heart regular S1/S2, no S3/S4, no murmur.  No peripheral edema.  No carotid bruit.  Normal pedal pulses.  Abdomen: Soft, nontender, no hepatosplenomegaly, no distention.  Skin: Intact without lesions or rashes.  Neurologic: Alert and oriented x 3.  Psych: Normal affect. Extremities: No clubbing or cyanosis.  Right groin small hematoma/ecchymosis.  HEENT: Normal.    Telemetry   NSR 70 personally reviewed.    Labs    CBC Recent Labs    07/16/18 0730 07/17/18 0408  WBC 11.5* 9.0  HGB 13.6 12.4*  HCT  41.3 38.3*  MCV 87.3 87.6  PLT 166 153   Basic Metabolic Panel Recent Labs    40/98/11 0730 07/17/18 0408  NA 139 137  K 4.5 4.4  CL 104 104  CO2 27 23  GLUCOSE 117* 107*  BUN 14 19  CREATININE 1.16 1.15  CALCIUM 9.2 9.0   Liver Function Tests No results for input(s): AST, ALT, ALKPHOS, BILITOT, PROT, ALBUMIN in the last 72 hours. No results for input(s): LIPASE, AMYLASE in the last 72 hours. Cardiac Enzymes No results for input(s): CKTOTAL, CKMB, CKMBINDEX, TROPONINI in the last 72 hours.  BNP: BNP (last 3 results) No results for input(s): BNP in the last 8760 hours.  ProBNP (last 3 results) No results for input(s): PROBNP in the last 8760 hours.   D-Dimer No results for input(s): DDIMER in the last 72 hours. Hemoglobin A1C No results for input(s): HGBA1C in the last 72 hours. Fasting Lipid Panel No results for input(s): CHOL, HDL, LDLCALC, TRIG, CHOLHDL, LDLDIRECT in the last 72 hours. Thyroid Function Tests No results for input(s): TSH, T4TOTAL, T3FREE, THYROIDAB in the last 72 hours.  Invalid input(s): FREET3  Other results:   Imaging    Mr Cardiac Morphology W Wo Contrast  Result Date: 07/16/2018 CLINICAL DATA:  Ischemic cardiomyopathy, assess viability. EXAM: CARDIAC MRI TECHNIQUE: The patient was scanned on a 1.5 Tesla  GE magnet. A dedicated cardiac coil was used. Functional imaging was done using Fiesta sequences. 2,3, and 4 chamber views were done to assess for RWMA's. Modified Simpson's rule using a short axis stack was used to calculate an ejection fraction on a dedicated work Research officer, trade unionstation using Circle software. The patient received 7.5 cc of Gadavist. After 10 minutes inversion recovery sequences were used to assess for infiltration and scar tissue. CONTRAST:  7.5 cc Gadavist FINDINGS: Limited images of the lung fields showed no gross abnormalities. There was a small pericardial effusion. Normal left ventricular size and wall thickness. There was akinesis of  the mid-apical anterior, anteroseptal and inferoseptal wall segments, the apical inferior wall, and the true apex. Moderate systolic dysfunction, EF 37%. No LV thrombus was seen. Normal right ventricular size with borderline decreased systolic function, EF 43%. Mildly dialted left atrium. Normal right atrial size. Trileaflet aortic valve with no regurgitation or stenosis. No significant mitral regurgitation. Delayed enhancement imaging showed 76-99% wall thickness subendocardial late gadolinium enhancement in the mid-apical anterior, anteroseptal and inferoseptal wall segments, the apical inferior wall, and the true apex. There was no-reflow in some areas. Measurements: LVEDV 166 mL LVSV 62 mL LVEF 37% RVEDV 81 mL RVSV 62 mL RVEF 43% IMPRESSION: 1. Normal LV size with wall motion abnormalities in an LAD infarct pattern, EF 37%. 2. Normal RV size with borderline decreased systolic function, EF 43%. 3. Delayed enhancement images suggest that the akinetic wall segments have significant coronary-pattern scar present and are unlikely to improve with revascularization. Anyiah Coverdale Electronically Signed   By: Marca Anconaalton  Eladia Frame M.D.   On: 07/16/2018 15:18     Medications:     Scheduled Medications: . aspirin EC  81 mg Oral Daily  . atorvastatin  80 mg Oral q1800  . lisinopril  2.5 mg Oral Daily  . metoprolol succinate  12.5 mg Oral QHS  . sodium chloride flush  3 mL Intravenous Q12H  . spironolactone  12.5 mg Oral Daily  . Warfarin - Pharmacist Dosing Inpatient   Does not apply q1800    Infusions: . sodium chloride    . nitroGLYCERIN Stopped (07/13/18 0442)    PRN Medications: sodium chloride, acetaminophen, morphine injection, nitroGLYCERIN, ondansetron (ZOFRAN) IV, sodium chloride flush   Assessment/Plan   1. CAD: Patient presented with anterior MI.  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  The continuation of the LAD  beyond the branch to the aneurysm is more normal in caliber and was totally occluded on initial cath, no intervention.  We did a coronary CT 2/14, showing thrombotic occlusion of the mid LAD beyond the coronary=>PA fistula branch but recanalization of the more distal LAD likely by collateralization.  No chest pain now. TnI > 65.  - Continue ASA and statin.  - He is on heparin gtt + warfarin with apical akinesis as well as aneurysmal LAD/saccular aneurysm leading to PA.  Hold heparin with increased bruising right groin, continue warfarin.  Will get right groin US today, if no fistula/PSA, would start on Lovenox bridge to therapeutic warfarin tomorrow am.  - The apical LAD territory did not appear viable on cardiac MRI. Therefore, would not recommend CABG. 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  No symptoms prior to this  MI.  3. Ischemic cardiomyopathy: EF 35-40% on echo, 37% on cardiac MRI. SBP remains soft but he has tolerated meds so far.  - Continue spironolactone.  - Increase Toprol XL to 25 mg daily.  - Continue lisinopril 2.5 daily.  4. Disposition: Home today if groin [place] is ok.  He will need followup in coumadin clinic and with me next week some time.  Meds for discharge: warfarin, Lovenox bridge to therapeutic warfarin (would not start until tomorrow am with groin ecchymosis), lisinopril 2.5 daily, Toprol XL 25 qhs, spironolactone 12.5 daily, atorvastatin 80 daily.  He should continue ASA 81 until INR therapeutic then stop.   Length of Stay: 4  Marca Ancona, MD  07/17/2018, 8:21 AM  Advanced Heart Failure Team Pager [phone] (M-F; 7a - 4p)  Please contact [facility] for night-coverage after hours (4p -7a ) and weekends on [email or web address]

## 2018-07-17 NOTE — Care Management Note (Signed)
Case Management Note  Patient Details  Name: Danian Yeoman MRN: [id number] Date of Birth: 1955-09-24  Subjective/Objective: Pt presented for Chest Pain. Pt initially visiting daughter from [place]. Per daughter plan will be to return to [place] within the next two months. Per MD plan for Lovenox Injections- Heart Failure Clinic to assist with this medication.            Action/Plan: Patients daughter wants hospital  bill to be sent to patient's address in [place]. CM did provide address to Admitting to place in Epic. [address] [place], [place] [id number] [place].  No transition of care needs identified for home for this patient.   Expected Discharge Date:  07/14/18               Expected Discharge Plan:  Home/Self Care  In-House Referral:  NA  Discharge planning Services  CM Consult, Medication Assistance, HF Clinic  Post Acute Care Choice:  NA Choice offered to:  NA  DME Arranged:  N/A DME Agency:  NA  HH Arranged:  NA HH Agency:  NA  Status of Service:  Completed, signed off  If discussed at Long Length of Stay Meetings, dates discussed:    Additional Comments:  Gala Lewandowsky, RN 07/17/2018, 1:33 PM

## 2018-07-17 NOTE — Progress Notes (Addendum)
On arrival pt up walking in room. I asked him to rest due to need for u/s for groin. We began education, daughter translating. Pt sts he has been feeling slightly nauseated and lightheaded, esp when in bed for two days. I took his BP, which was 98/67.   During ed pt suddenly felt more dizzy and laid himself back. I took his BP, which was 115/76. HR is NSR, in 60s. Through translation pt sts he initially felt like he was going to pass out. He felt some better (no longer presyncopal) but continued to feel poorly. Sts he is having episodes or waves through his head and body. Sts his chest feels tight and his arms and legs are numb/ tingling. His face/head felt tight or maybe flushed. Pt sts he has to focus on staying conscious. While I was with him, pt had another episode (10 min after the first) that was similar to the first. He felt like he might lose consciousness. RN aware and eventually came and called MD. Another BP 116/74.   We had discussed MI, restrictions, HF management (booklet) so far. Will continue education later or tomorrow. [id number] Ethelda Chick CES, ACSM 10:38 AM 07/17/2018   Returned and finished ed with pt and daughter. Gave walking guidelines, discussed NTG, and CRPII. Will refer to [place] CRPII however daughter does not think pt will be interested. Will be eager to exercise at home however. Good reception. [id number] Ethelda Chick CES, ACSM 2:05 PM 07/17/2018

## 2018-07-17 NOTE — Progress Notes (Signed)
   Paged for chest pain and headache.   RN states approx 30 minutes after stopping heparin he started having waves of chest and head pain. He reported his head feeling "tight" and like he was "going in and out of consciousness". He is alert and oriented, and able to talk with his daughter, who translates, without difficulty.  States he had a similar episode in December, that spontaneously resolved. Daughter states during this episode he "shook" almost like he was having a seizure, but that he was also alert/oriented at that time.   He currently denies any pain, but still having "feeling in his head" and like he's having "difficulty staying conscious".   BP 116/74.   Discussed with Dr. Shirlee Latch. Will get STAT head CT to r/o any acute cause. No obvious focal neuro abnormalities.   Graciella Freer, PA-C  07/17/18 10:46 AM

## 2018-07-17 NOTE — Significant Event (Signed)
Rapid Response Event Note  Overview: Time Called: 1048 Arrival Time: 1052 Event Type: Neurologic  Initial Focused Assessment: Called to bedside because concern of slurred/garbled speech. He also complains of tingling in bilateral upper extremities.  He is non [place] speaking.  His daughter is at bedside and is able to translate for him.   Heparin gtt recently d/c'd Bruising at right groin site, but no complaints of pain in back/hip or leg.  Interventions: NIHSS 0 He appears fatigued He is able to repeat english words clearly. When he speaks in his native tongue with his daughter, she said he pronounced words clearly just seemed a bit slow.  Plan of Care (if not transferred): Stat head CT RN to call if assistance needed.  Event Summary: Name of Physician Notified: Otilio Saber at 1044    at    Outcome: Stayed in room and stabalized     [place], [place]

## 2018-07-19 ENCOUNTER — Telehealth (HOSPITAL_COMMUNITY): Payer: Self-pay

## 2018-07-19 NOTE — Telephone Encounter (Signed)
Pt was seen by ph. 1, pt stated he will work out at home.  Closed referral

## 2018-07-20 ENCOUNTER — Ambulatory Visit (INDEPENDENT_AMBULATORY_CARE_PROVIDER_SITE_OTHER): Payer: Self-pay | Admitting: *Deleted

## 2018-07-20 DIAGNOSIS — I213 ST elevation (STEMI) myocardial infarction of unspecified site: Secondary | ICD-10-CM

## 2018-07-20 DIAGNOSIS — I8290 Acute embolism and thrombosis of unspecified vein: Secondary | ICD-10-CM

## 2018-07-20 LAB — POCT INR: INR: 6.8 — AB (ref 2.0–3.0)

## 2018-07-20 NOTE — Patient Instructions (Addendum)
Description   Hold Coumadin today, Saturday and Sundays dose. Discontinue Lovenox injection.  Recheck on Monday.

## 2018-07-23 ENCOUNTER — Ambulatory Visit (INDEPENDENT_AMBULATORY_CARE_PROVIDER_SITE_OTHER): Payer: Self-pay | Admitting: Pharmacist Clinician (PhC)/ Clinical Pharmacy Specialist

## 2018-07-23 DIAGNOSIS — I213 ST elevation (STEMI) myocardial infarction of unspecified site: Secondary | ICD-10-CM

## 2018-07-23 DIAGNOSIS — I8290 Acute embolism and thrombosis of unspecified vein: Secondary | ICD-10-CM

## 2018-07-23 LAB — POCT INR: INR: 1.9 — AB (ref 2.0–3.0)

## 2018-07-23 NOTE — Patient Instructions (Signed)
Take 1/2 table on Monday and Wednesday, 1 tablet on Tuesday and Thursday.  Repeat INR Friday

## 2018-07-25 ENCOUNTER — Telehealth (HOSPITAL_COMMUNITY): Payer: Self-pay

## 2018-07-25 NOTE — Telephone Encounter (Signed)
Pts daughter called to reschedule appt as she will not be able to accompany him to the March 3rd appt. Pt rescheduled to 3/20. Daughter reports patient doing well. Advised to call office or go to ED if he has any concerns at home.  States understanding and appreciative.

## 2018-07-27 ENCOUNTER — Ambulatory Visit (INDEPENDENT_AMBULATORY_CARE_PROVIDER_SITE_OTHER): Payer: Self-pay | Admitting: *Deleted

## 2018-07-27 DIAGNOSIS — I8290 Acute embolism and thrombosis of unspecified vein: Secondary | ICD-10-CM

## 2018-07-27 DIAGNOSIS — I213 ST elevation (STEMI) myocardial infarction of unspecified site: Secondary | ICD-10-CM

## 2018-07-27 DIAGNOSIS — Z5181 Encounter for therapeutic drug level monitoring: Secondary | ICD-10-CM

## 2018-07-27 LAB — POCT INR: INR: 2.8 (ref 2.0–3.0)

## 2018-07-31 ENCOUNTER — Inpatient Hospital Stay (HOSPITAL_COMMUNITY): Payer: Self-pay | Admitting: Cardiology

## 2018-08-02 ENCOUNTER — Ambulatory Visit (INDEPENDENT_AMBULATORY_CARE_PROVIDER_SITE_OTHER): Payer: Self-pay | Admitting: Pharmacist Clinician (PhC)/ Clinical Pharmacy Specialist

## 2018-08-02 DIAGNOSIS — I8290 Acute embolism and thrombosis of unspecified vein: Secondary | ICD-10-CM

## 2018-08-02 DIAGNOSIS — Z7901 Long term (current) use of anticoagulants: Secondary | ICD-10-CM

## 2018-08-02 DIAGNOSIS — I213 ST elevation (STEMI) myocardial infarction of unspecified site: Secondary | ICD-10-CM

## 2018-08-02 LAB — POCT INR: INR: 3.4 — AB (ref 2.0–3.0)

## 2018-08-02 MED ORDER — WARFARIN SODIUM 2.5 MG PO TABS: 2.5000 mg | ORAL_TABLET | Freq: Every day | ORAL | 3 refills | Status: DC

## 2018-08-09 ENCOUNTER — Ambulatory Visit (INDEPENDENT_AMBULATORY_CARE_PROVIDER_SITE_OTHER): Payer: Self-pay | Admitting: Pharmacist Clinician (PhC)/ Clinical Pharmacy Specialist

## 2018-08-09 ENCOUNTER — Other Ambulatory Visit: Payer: Self-pay

## 2018-08-09 DIAGNOSIS — I8290 Acute embolism and thrombosis of unspecified vein: Secondary | ICD-10-CM

## 2018-08-09 DIAGNOSIS — I213 ST elevation (STEMI) myocardial infarction of unspecified site: Secondary | ICD-10-CM

## 2018-08-09 DIAGNOSIS — Z7901 Long term (current) use of anticoagulants: Secondary | ICD-10-CM

## 2018-08-09 LAB — POCT INR: INR: 1.7 — AB (ref 2.0–3.0)

## 2018-08-09 NOTE — Patient Instructions (Signed)
  Switch to green 2.5 mg tablets.  Take 1 tablet daily.  Repeat INR in 10 days

## 2018-08-16 MED FILL — ATORVASTATIN 80 MG TABLET: 80 | 34 days supply | Qty: 34 | Fill #1 | Status: TO

## 2018-08-16 MED FILL — LISINOPRIL 2.5 MG TABLET: 2.5 | 34 days supply | Qty: 34 | Fill #1 | Status: TO

## 2018-08-16 MED FILL — SPIRONOLACTONE 25 MG TABLET: 25 | 34 days supply | Qty: 17 | Fill #1 | Status: TO

## 2018-08-16 MED FILL — METOPROLOL SUCCINATE ER 25: 25 | 34 days supply | Qty: 34 | Fill #1

## 2018-08-17 ENCOUNTER — Inpatient Hospital Stay (HOSPITAL_COMMUNITY): Payer: Self-pay | Admitting: Cardiology

## 2018-08-17 ENCOUNTER — Telehealth: Payer: Self-pay | Admitting: *Deleted

## 2018-08-17 NOTE — Telephone Encounter (Signed)
1. Have you recently travelled abroad or to [place], [place], or [place]? NO 2. Do you currently have a fever? No 3. Have you been in contact with someone that is currently pending confirmation of COVID19 testing or has been confirmed to have the COVID19 virus? No 4. Are you currently experiencing fatigue or cough? NO 5. Are you currently experiencing new or worsening shortness of breath at rest or with minimal activity? No 6. Have you been in contact with someone that was recently sick with fever/cough/fatigue? NO   **A score of 4 or more should result in cancellation of the pts cardiology appt  **A score of 2 should be provided a mask prior to admission into the lobby  **TRAVEL to a high risk area or contact with a confirmed case should stay at home, away from confirmed patient, monitor symptoms, and reach out to PCP for evisit, additional testing.   **ALL PTS WITH FEVER SHOULD BE REFERRED TO PCP FOR EVISIT  Pt. Advised that we are restricting visitors at this time and request that only patients present for check-in prior to their appointment. All other visitors should remain in their car. If necessary, only one visitor may come with the patient into the building. For everyone's safety, all patients and visitors entering our practice area should expect to be screened again prior to entering our waiting area.

## 2018-08-20 ENCOUNTER — Ambulatory Visit (INDEPENDENT_AMBULATORY_CARE_PROVIDER_SITE_OTHER): Payer: Self-pay | Admitting: *Deleted

## 2018-08-20 ENCOUNTER — Other Ambulatory Visit: Payer: Self-pay

## 2018-08-20 DIAGNOSIS — I8290 Acute embolism and thrombosis of unspecified vein: Secondary | ICD-10-CM

## 2018-08-20 DIAGNOSIS — Z5181 Encounter for therapeutic drug level monitoring: Secondary | ICD-10-CM

## 2018-08-20 DIAGNOSIS — I213 ST elevation (STEMI) myocardial infarction of unspecified site: Secondary | ICD-10-CM

## 2018-08-20 LAB — POCT INR: INR: 1.8 — AB (ref 2.0–3.0)

## 2018-08-22 ENCOUNTER — Other Ambulatory Visit: Payer: Self-pay

## 2018-08-22 ENCOUNTER — Encounter (HOSPITAL_COMMUNITY): Payer: Self-pay

## 2018-08-22 ENCOUNTER — Ambulatory Visit (HOSPITAL_COMMUNITY)
Admission: RE | Admit: 2018-08-22 | Discharge: 2018-08-22 | Disposition: A | Discharge status: Home / Self Care | Payer: Self-pay | Source: Ambulatory Visit | Attending: Cardiology | Admitting: Cardiology

## 2018-08-22 DIAGNOSIS — I5022 Chronic systolic (congestive) heart failure: Secondary | ICD-10-CM

## 2018-08-22 MED ORDER — LISINOPRIL 2.5 MG PO TABS: 2.5000 mg | ORAL_TABLET | Freq: Every evening | ORAL | 3 refills | Status: DC

## 2018-08-22 NOTE — Progress Notes (Signed)
Heart Failure TeleHealth Note  Due to national recommendations of social distancing due to COVID 19, Audio telehealth visit is felt to be most appropriate for this patient at this time.  See MyChart message from today for patient consent regarding telehealth for [facility].  Date:  08/22/2018   ID:  Bryan, Vazquez [birth year], MRN [id number]  Location: Home  Provider location: [address] [place] Type of Visit:  Established patient  PCP:  Patient, No Pcp Per  Cardiologist:  Dr. Shirlee Latch  Chief Complaint: Dizziness   History of Present Illness: Bryan Vazquez is a 63 y.o. male who presents via audio conferencing for a telehealth visit today.     Today,  he denies symptoms of cough, fevers, chills, or new SOB worrisome for COVID 19.    Bryan Vazquez was admitted with an anterior MI in 2/20.  Prior to that, he had been in excellent health on no medications.  He exercised regularly with no exertional dyspnea or chest pain.  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  The continuation of the LAD beyond the branch to the aneurysm was more normal in caliber and was totally occluded on initial cath, no intervention was done.  We did a coronary CT 2/14, showing thrombotic occlusion of the mid LAD beyond the coronary=>PA fistula branch but recanalization of the more distal LAD likely by collateralization. Cardiac MRI showed EF 37% with non-viable LAD territory. Echo with EF 35-40%.  With non-viable LAD territory, we decided against CABG + coronary-PA fistula ligation.    Patient has been doing fairly well at home.  He has not returned to [place] due to the coronavirus pandemic and will be staying with his daughter here for the time being.  No chest pain.  He is walking twice daily for 20-30 minutes with no dyspnea.  When he first left the hospital, he was having  significant lightheadedness but this has subsided and is relatively mild now when he first stands up.  BP was 95/60 today and he felt fine. No BRBPR/melena. He has been having his INR checked regularly.   Labs (2/20): K 4.4, creatinine 1.15, hgb 12.4, LDL 157  1. CAD: Anterior MI in 2/20. 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  The continuation of the LAD beyond the branch to the aneurysm is more normal in caliber and was totally occluded on initial cath, no intervention.  We did a coronary CT 2/14, showing thrombotic occlusion of the mid LAD beyond the coronary=>PA fistula branch but recanalization of the more distal LAD likely by collateralization.  - Cardiac MRI (2/20) showed that the apical LAD territory did not appear viable.  2. Coronary-PA fistula: Congenital.  See discussion above.  3. Chronic systolic CHF: Echo (2/20) with EF 35-40%, peri-apical akinesis, normal RV.  Cardiac MRI (2/20) with EF 37%, peri-apical coronary-pattern scar (unlikely to be viable with revascularization), RV EF 43%.  4. Hyperlipidemia  Current Outpatient Medications  Medication Sig Dispense Refill   acetaminophen (TYLENOL) 325 MG tablet Take 2 tablets (650 mg total) by mouth every 4 (four) hours as needed for headache or mild pain.     aspirin 81 MG EC tablet Take 1 tablet (81 mg total) by mouth daily. 30 tablet 3  atorvastatin (LIPITOR) 80 MG tablet Take 1 tablet (80 mg total) by mouth daily at 6 PM.     lisinopril (PRINIVIL,ZESTRIL) 2.5 MG tablet Take 1 tablet (2.5 mg total) by mouth every evening. 90 tablet 3   metoprolol succinate (TOPROL-XL) 25 MG 24 hr tablet Take 1 tablet (25 mg total) by mouth at bedtime. (Patient taking differently: Take 12.5 mg by mouth at bedtime. )     nitroGLYCERIN (NITROSTAT) 0.4 MG SL tablet Place 1 tablet (0.4 mg total) under the tongue every 5 (five) minutes x 3 doses as needed for chest pain. 60  tablet 12   spironolactone (ALDACTONE) 25 MG tablet Take 0.5 tablets (12.5 mg total) by mouth daily.     warfarin (COUMADIN) 2.5 MG tablet Take 1 tablet (2.5 mg total) by mouth daily. 30 tablet 3   No current facility-administered medications for this encounter.     Allergies:   Patient has no known allergies.   Social History:  The patient  reports that he has never smoked. He has never used smokeless tobacco. He reports that he does not drink alcohol or use drugs. He is originally from [place] and is staying with his daughter currently.  He does not speak [place].   Family History:  No premature CAD.   ROS:  Please see the history of present illness.   All other systems are personally reviewed and negative.   Exam:  (Tele Health Call; Exam is subjective.) BP 95/60 General:  No resp difficulty. Lungs: Normal respiratory effort with conversation.  Abdomen: Non-distended. Pt denies tenderness with self palpation.  Extremities: Pt denies edema. Neuro: Alert & oriented x 3.   Recent Labs: 07/17/2018: BUN 19; Creatinine, Ser 1.15; Hemoglobin 12.4; Platelets 153; Potassium 4.4; Sodium 137  Personally reviewed   Wt Readings from Last 3 Encounters:  07/17/18 69.2 kg (152 lb 8 oz)    ASSESSMENT AND PLAN:  1. CAD: Patient presented with anterior MI.  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  The continuation of the LAD beyond the branch to the aneurysm was more normal in caliber and was totally occluded on initial cath, no intervention.  We did a coronary CT 2/14, showing thrombotic occlusion of the mid LAD beyond the coronary=>PA fistula branch but recanalization of the more distal LAD likely by collateralization.  Non-viable LAD territory on cardiac MRI so we decided against LIMA-LAD + coronary-PA fistula ligation. 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 No chest pain now. - Continue high dose statin. - He is not on ASA due to warfarin use.   - He is on warfarin with apical akinesis as well as aneurysmal LAD/saccular aneurysm leading to PA.    3. Ischemic cardiomyopathy: EF 35-40% on echo, 37% on cardiac MRI. BP is too soft to titrate meds.  Still mild lightheadedness but he can tolerate.   - Continue spironolactone 12.5 mg daily. He needs BMET, will arrange.  - Continue Toprol XL 25 mg daily.  - Continue lisinopril 2.5 daily but take at bedtime.  4. Hyperlipidemia: I will arrange for lipids/LFTs.   COVID screen The patient does not have any symptoms that suggest any further testing/ screening at this time.  Social distancing reinforced today.  Recommended follow-up:  F/u 6 wks  Relevant cardiac medications were  reviewed at length with the patient today.   The patient does not have concerns regarding their medications at this time.   Patient Risk: After full review of this patients clinical status, I feel that they are at moderate risk for cardiac decompensation at this time.  Today, I have spent 22 minutes with the patient with telehealth technology discussing CAD, CHF.    Signed, Marca Anconaalton Kalijah Zeiss, MD  08/22/2018 3:43 PM  [facility] [address] [facility] [place] [address] [phone] (office) [phone] (fax)

## 2018-08-22 NOTE — Patient Instructions (Signed)
Lab work will need to be done 08/23/18 at 11:45am  CHANGE Lisinopril to 2.5mg  (1 tab) every evening.  Follow up with Dr. Shirlee Latch in 6 weeks (10/05/18).

## 2018-08-23 ENCOUNTER — Ambulatory Visit (HOSPITAL_COMMUNITY)
Admission: RE | Admit: 2018-08-23 | Discharge: 2018-08-23 | Disposition: A | Discharge status: Home / Self Care | Payer: Self-pay | Source: Ambulatory Visit | Attending: Cardiology | Admitting: Cardiology

## 2018-08-23 ENCOUNTER — Other Ambulatory Visit: Payer: Self-pay

## 2018-08-23 DIAGNOSIS — I5022 Chronic systolic (congestive) heart failure: Secondary | ICD-10-CM | POA: Insufficient documentation

## 2018-08-23 LAB — CBC
HCT: 45.1 % (ref 39.0–52.0)
Hemoglobin: 14.4 g/dL (ref 13.0–17.0)
MCH: 28.1 pg (ref 26.0–34.0)
MCHC: 31.9 g/dL (ref 30.0–36.0)
MCV: 88.1 fL (ref 80.0–100.0)
PLATELETS: 194 10*3/uL (ref 150–400)
RBC: 5.12 MIL/uL (ref 4.22–5.81)
RDW: 12.5 % (ref 11.5–15.5)
WBC: 7 10*3/uL (ref 4.0–10.5)
nRBC: 0 % (ref 0.0–0.2)

## 2018-08-23 LAB — COMPREHENSIVE METABOLIC PANEL
ALT: 35 U/L (ref 0–44)
AST: 28 U/L (ref 15–41)
Albumin: 3.9 g/dL (ref 3.5–5.0)
Alkaline Phosphatase: 30 U/L — ABNORMAL LOW (ref 38–126)
Anion gap: 8 (ref 5–15)
BILIRUBIN TOTAL: 0.9 mg/dL (ref 0.3–1.2)
BUN: 18 mg/dL (ref 8–23)
CO2: 23 mmol/L (ref 22–32)
Calcium: 9.4 mg/dL (ref 8.9–10.3)
Chloride: 105 mmol/L (ref 98–111)
Creatinine, Ser: 0.99 mg/dL (ref 0.61–1.24)
GFR calc Af Amer: >60 mL/min (ref >60)
Glucose, Bld: 107 mg/dL — ABNORMAL HIGH (ref 70–99)
Potassium: 4.2 mmol/L (ref 3.5–5.1)
Sodium: 136 mmol/L (ref 135–145)
Total Protein: 7.5 g/dL (ref 6.5–8.1)

## 2018-08-23 LAB — TSH: TSH: 0.542 u[IU]/mL (ref 0.350–4.500)

## 2018-08-30 ENCOUNTER — Telehealth: Payer: Self-pay

## 2018-08-30 NOTE — Telephone Encounter (Signed)
1. Do you currently have a fever? no (yes = cancel and refer to pcp for e-visit) 2. Have you recently travelled on a cruise, internationally, or to [place], [place], [place], [place], [place], or [place], [place] [employer]) ? no (yes = cancel, stay home, monitor symptoms, and contact pcp or initiate e-visit if symptoms develop) 3. Have you been in contact with someone that is currently pending confirmation of Covid19 testing or has been confirmed to have the Covid19 virus?  noo (yes = cancel, stay home, away from tested individual, monitor symptoms, and contact pcp or initiate e-visit if symptoms develop) 4. Are you currently experiencing fatigue or cough? no (yes = pt should be prepared to have a mask placed at the time of their visit).  Pt. Advised that we are restricting visitors at this time and anyone present in the vehicle should meet the above criteria as well. Advised that visit will be at curbside for finger stick ONLY and will receive call with instructions. Pt also advised to please bring own pen for signature of arrival document.

## 2018-08-31 ENCOUNTER — Ambulatory Visit (INDEPENDENT_AMBULATORY_CARE_PROVIDER_SITE_OTHER): Payer: Self-pay | Admitting: Pharmacist

## 2018-08-31 ENCOUNTER — Other Ambulatory Visit: Payer: Self-pay

## 2018-08-31 DIAGNOSIS — I213 ST elevation (STEMI) myocardial infarction of unspecified site: Secondary | ICD-10-CM

## 2018-08-31 DIAGNOSIS — I8290 Acute embolism and thrombosis of unspecified vein: Secondary | ICD-10-CM

## 2018-08-31 LAB — POCT INR: INR: 2.1 (ref 2.0–3.0)

## 2018-09-17 MED FILL — ATORVASTATIN 80 MG TABLET: 80 | 34 days supply | Qty: 34 | Fill #0

## 2018-09-17 MED FILL — SPIRONOLACTONE 25 MG TABS: 25 | 34 days supply | Qty: 17 | Fill #0

## 2018-09-17 MED FILL — LISINOPRIL 2.5 MG TABLET: 2.5 | 34 days supply | Qty: 34 | Fill #0

## 2018-09-19 ENCOUNTER — Telehealth: Payer: Self-pay

## 2018-09-19 NOTE — Telephone Encounter (Signed)

## 2018-09-21 ENCOUNTER — Telehealth: Payer: Self-pay | Admitting: Cardiology

## 2018-09-21 NOTE — Telephone Encounter (Signed)
Did not need this encounter °

## 2018-09-26 ENCOUNTER — Telehealth: Payer: Self-pay

## 2018-09-26 NOTE — Telephone Encounter (Signed)
lmom for prescreen  

## 2018-10-03 ENCOUNTER — Telehealth: Payer: Self-pay

## 2018-10-03 NOTE — Telephone Encounter (Signed)

## 2018-10-05 ENCOUNTER — Ambulatory Visit (INDEPENDENT_AMBULATORY_CARE_PROVIDER_SITE_OTHER): Payer: Self-pay | Admitting: *Deleted

## 2018-10-05 ENCOUNTER — Other Ambulatory Visit: Payer: Self-pay

## 2018-10-05 ENCOUNTER — Encounter (HOSPITAL_COMMUNITY): Payer: Self-pay | Admitting: Cardiology

## 2018-10-05 DIAGNOSIS — I8290 Acute embolism and thrombosis of unspecified vein: Secondary | ICD-10-CM

## 2018-10-05 DIAGNOSIS — I213 ST elevation (STEMI) myocardial infarction of unspecified site: Secondary | ICD-10-CM

## 2018-10-05 LAB — POCT INR: INR: 1.8 — AB (ref 2.0–3.0)

## 2018-10-05 NOTE — Patient Instructions (Addendum)
Spoke with Pt's daughter and instructed to take 1.5 tabs today and then continue taking 1 tablet daily except 1.5 tablets on Mondays and Thursdays. Repeat INR in 2 weeks.

## 2018-10-18 ENCOUNTER — Telehealth: Payer: Self-pay

## 2018-10-18 NOTE — Telephone Encounter (Signed)
lmom for prescreen  

## 2018-10-19 ENCOUNTER — Ambulatory Visit (INDEPENDENT_AMBULATORY_CARE_PROVIDER_SITE_OTHER): Payer: Self-pay | Admitting: *Deleted

## 2018-10-19 ENCOUNTER — Other Ambulatory Visit: Payer: Self-pay

## 2018-10-19 DIAGNOSIS — I8290 Acute embolism and thrombosis of unspecified vein: Secondary | ICD-10-CM

## 2018-10-19 DIAGNOSIS — I213 ST elevation (STEMI) myocardial infarction of unspecified site: Secondary | ICD-10-CM

## 2018-10-19 DIAGNOSIS — Z5181 Encounter for therapeutic drug level monitoring: Secondary | ICD-10-CM

## 2018-10-19 LAB — POCT INR: INR: 2 (ref 2.0–3.0)

## 2018-10-19 NOTE — Patient Instructions (Signed)
Description   Spoke with pt's daughter and instructed to change dose to 1 tablet daily except 1.5 tablets on Mondays, Thursday, and Saturdays.   Repeat INR in 3 weeks at [facility] location.

## 2018-10-23 ENCOUNTER — Other Ambulatory Visit (HOSPITAL_COMMUNITY): Payer: Self-pay | Admitting: Student

## 2018-10-23 MED ORDER — LISINOPRIL 2.5 MG PO TABS: 2.5000 mg | ORAL_TABLET | Freq: Every day | ORAL | 5 refills | Status: DC

## 2018-10-23 MED ORDER — ATORVASTATIN CALCIUM 80 MG PO TABS: 80.0000 mg | ORAL_TABLET | Freq: Every day | ORAL | 5 refills | Status: AC

## 2018-10-23 MED ORDER — SPIRONOLACTONE 25 MG PO TABS: 12.5000 mg | ORAL_TABLET | Freq: Every day | ORAL | 5 refills | Status: DC

## 2018-10-23 MED FILL — LISINOPRIL 2.5 MG TABLET: 2.5 | 34 days supply | Qty: 34 | Fill #0

## 2018-10-23 MED FILL — METOPROLOL SUCCINATE ER 25: 25 | 34 days supply | Qty: 34 | Fill #2

## 2018-10-23 MED FILL — SPIRONOLACTONE 25 MG TABLET: 25 | 34 days supply | Qty: 17 | Fill #0

## 2018-10-23 MED FILL — ATORVASTATIN 80 MG TABLET: 80 | 34 days supply | Qty: 34 | Fill #0

## 2018-10-23 NOTE — Addendum Note (Signed)
Addended by: Marisa Hua on: 10/23/2018 04:04 PM   Modules accepted: Orders

## 2018-10-30 ENCOUNTER — Telehealth: Payer: Self-pay

## 2018-10-30 NOTE — Telephone Encounter (Signed)
lmom for prescreen  

## 2018-11-02 ENCOUNTER — Other Ambulatory Visit (HOSPITAL_COMMUNITY): Payer: Self-pay | Admitting: *Deleted

## 2018-11-02 ENCOUNTER — Telehealth: Payer: Self-pay | Admitting: *Deleted

## 2018-11-02 ENCOUNTER — Other Ambulatory Visit: Payer: Self-pay

## 2018-11-02 ENCOUNTER — Ambulatory Visit (INDEPENDENT_AMBULATORY_CARE_PROVIDER_SITE_OTHER): Payer: Self-pay | Admitting: *Deleted

## 2018-11-02 DIAGNOSIS — Z5181 Encounter for therapeutic drug level monitoring: Secondary | ICD-10-CM

## 2018-11-02 DIAGNOSIS — I213 ST elevation (STEMI) myocardial infarction of unspecified site: Secondary | ICD-10-CM

## 2018-11-02 DIAGNOSIS — I8290 Acute embolism and thrombosis of unspecified vein: Secondary | ICD-10-CM

## 2018-11-02 LAB — POCT INR: INR: 1.9 — AB (ref 2.0–3.0)

## 2018-11-02 MED ORDER — WARFARIN SODIUM 2.5 MG PO TABS: 2.5000 mg | ORAL_TABLET | Freq: Every day | ORAL | 3 refills | Status: DC

## 2018-11-02 MED FILL — WARFARIN SODIUM 2.5 MG TAB: 2.5 | 30 days supply | Qty: 30 | Fill #0

## 2018-11-02 NOTE — Telephone Encounter (Signed)
Per pt daughter:   COVID-19 Pre-Screening Questions:  . In the past 7 to 10 days have you had a cough,  shortness of breath, headache, congestion, fever (100 or greater) body aches, chills, sore throat, or sudden loss of taste or sense of smell?  No . Have you been around anyone with known Covid 19.  No . Have you been around anyone who is awaiting Covid 19 test results in the past 7 to 10 days?  No . Have you been around anyone who has been exposed to Covid 19, or has mentioned symptoms of Covid 19 within the past 7 to 10 days?  No    2. Pt advised of visitor restrictions (no visitors allowed except if needed to conduct the visit). Also advised to arrive at appointment time and wear a mask.

## 2018-11-02 NOTE — Patient Instructions (Signed)
Description   Today take 1.5 tablets, then start taking 1.5 tablets daily except 1 tablet on Mondays, Wednesdays and Fridays. Repeat INR in 2 weeks at [facility] location.

## 2018-11-03 NOTE — Progress Notes (Signed)
FYI

## 2018-11-12 ENCOUNTER — Telehealth: Payer: Self-pay

## 2018-11-12 NOTE — Telephone Encounter (Signed)
lmom for prescreen  

## 2018-11-16 ENCOUNTER — Other Ambulatory Visit: Payer: Self-pay | Admitting: Pharmacist

## 2018-11-16 ENCOUNTER — Other Ambulatory Visit: Payer: Self-pay

## 2018-11-16 ENCOUNTER — Other Ambulatory Visit (HOSPITAL_COMMUNITY): Payer: Self-pay

## 2018-11-16 ENCOUNTER — Other Ambulatory Visit (HOSPITAL_COMMUNITY): Payer: Self-pay | Admitting: Student

## 2018-11-16 ENCOUNTER — Ambulatory Visit (INDEPENDENT_AMBULATORY_CARE_PROVIDER_SITE_OTHER): Payer: Self-pay | Admitting: *Deleted

## 2018-11-16 DIAGNOSIS — I8290 Acute embolism and thrombosis of unspecified vein: Secondary | ICD-10-CM

## 2018-11-16 DIAGNOSIS — I213 ST elevation (STEMI) myocardial infarction of unspecified site: Secondary | ICD-10-CM

## 2018-11-16 DIAGNOSIS — Z5181 Encounter for therapeutic drug level monitoring: Secondary | ICD-10-CM

## 2018-11-16 LAB — POCT INR: INR: 2.2 (ref 2.0–3.0)

## 2018-11-16 MED ORDER — WARFARIN SODIUM 2.5 MG PO TABS: ORAL_TABLET | ORAL | 1 refills | Status: DC

## 2018-11-16 MED ORDER — METOPROLOL SUCCINATE ER 25 MG PO TB24: 12.5000 mg | ORAL_TABLET | Freq: Every day | ORAL | Status: DC

## 2018-11-16 MED ORDER — SPIRONOLACTONE 25 MG PO TABS: 12.5000 mg | ORAL_TABLET | Freq: Every day | ORAL | 5 refills | Status: AC

## 2018-11-16 MED ORDER — METOPROLOL SUCCINATE ER 25 MG PO TB24: 25.0000 mg | ORAL_TABLET | Freq: Every day | ORAL | Status: DC

## 2018-11-16 MED ORDER — LOSARTAN POTASSIUM 25 MG PO TABS: 25.0000 mg | ORAL_TABLET | Freq: Every day | ORAL | 1 refills | Status: DC

## 2018-11-16 MED FILL — SPIRONOLACTONE 25 MG TABLET: 25 | 34 days supply | Qty: 17 | Fill #1

## 2018-11-16 MED FILL — LOSARTAN POTASSIUM 25 MG TA: 25 | 30 days supply | Qty: 30 | Fill #0

## 2018-11-16 MED FILL — WARFARIN SODIUM 2.5 MG TAB: 2.5 | 34 days supply | Qty: 45 | Fill #0

## 2018-11-16 MED FILL — ATORVASTATIN 80 MG TABLET: 80 | 34 days supply | Qty: 34 | Fill #1

## 2018-11-16 NOTE — Patient Instructions (Signed)
Description   Start  taking 1.5 tablets daily except 1 tablet on Mondays, Wednesdays and Fridays. Repeat INR in 3 weeks.

## 2018-11-16 NOTE — Telephone Encounter (Signed)
Patient developed persistent cough with lisinopril 2.5mg . Will discontinue lisinopril today and start losartan in 48 hours.

## 2018-12-03 ENCOUNTER — Telehealth: Payer: Self-pay

## 2018-12-03 NOTE — Telephone Encounter (Signed)

## 2018-12-07 ENCOUNTER — Other Ambulatory Visit: Payer: Self-pay

## 2018-12-07 ENCOUNTER — Ambulatory Visit (INDEPENDENT_AMBULATORY_CARE_PROVIDER_SITE_OTHER): Payer: Self-pay | Admitting: *Deleted

## 2018-12-07 DIAGNOSIS — I213 ST elevation (STEMI) myocardial infarction of unspecified site: Secondary | ICD-10-CM

## 2018-12-07 DIAGNOSIS — I8290 Acute embolism and thrombosis of unspecified vein: Secondary | ICD-10-CM

## 2018-12-07 DIAGNOSIS — Z5181 Encounter for therapeutic drug level monitoring: Secondary | ICD-10-CM

## 2018-12-07 LAB — POCT INR: INR: 2.6 (ref 2.0–3.0)

## 2018-12-07 NOTE — Patient Instructions (Addendum)
Description   Continue taking 1.5 tablets daily except 1 tablet on Mondays, Wednesdays and Fridays. Repeat INR in 3 weeks.

## 2018-12-14 ENCOUNTER — Other Ambulatory Visit (HOSPITAL_COMMUNITY): Payer: Self-pay | Admitting: Student

## 2018-12-14 MED FILL — SPIRONOLACTONE 25 MG TABLET: 25 | 34 days supply | Qty: 17 | Fill #2

## 2018-12-14 MED FILL — LOSARTAN POTASSIUM 25 MG TA: 25 | 30 days supply | Qty: 30 | Fill #1

## 2018-12-14 MED FILL — METOPROLOL SUCCINATE ER 25: 25 | 34 days supply | Qty: 34 | Fill #0

## 2018-12-27 ENCOUNTER — Other Ambulatory Visit: Payer: Self-pay

## 2018-12-27 ENCOUNTER — Ambulatory Visit (INDEPENDENT_AMBULATORY_CARE_PROVIDER_SITE_OTHER): Payer: Self-pay | Admitting: Pharmacist

## 2018-12-27 DIAGNOSIS — I213 ST elevation (STEMI) myocardial infarction of unspecified site: Secondary | ICD-10-CM

## 2018-12-27 DIAGNOSIS — I8290 Acute embolism and thrombosis of unspecified vein: Secondary | ICD-10-CM

## 2018-12-27 DIAGNOSIS — Z5181 Encounter for therapeutic drug level monitoring: Secondary | ICD-10-CM

## 2018-12-27 LAB — POCT INR: INR: 2.4 (ref 2.0–3.0)

## 2019-01-02 MED FILL — WARFARIN SODIUM 2.5 MG TAB: 2.5 | 34 days supply | Qty: 45 | Fill #1

## 2019-01-16 ENCOUNTER — Other Ambulatory Visit (HOSPITAL_COMMUNITY): Payer: Self-pay | Admitting: *Deleted

## 2019-01-16 ENCOUNTER — Other Ambulatory Visit: Payer: Self-pay | Admitting: Cardiology

## 2019-01-16 MED ORDER — WARFARIN SODIUM 2.5 MG PO TABS: ORAL_TABLET | ORAL | 1 refills | Status: AC

## 2019-01-16 MED ORDER — LOSARTAN POTASSIUM 25 MG PO TABS: 25.0000 mg | ORAL_TABLET | Freq: Every day | ORAL | 1 refills | Status: AC

## 2019-01-16 MED FILL — ATORVASTATIN 80 MG TABLET: 80 | 34 days supply | Qty: 34 | Fill #2

## 2019-01-16 MED FILL — SPIRONOLACTONE 25 MG TABLET: 25 | 34 days supply | Qty: 17 | Fill #3

## 2019-01-16 MED FILL — WARFARIN SODIUM 2.5 MG TAB: 2.5 | 34 days supply | Qty: 45 | Fill #0

## 2019-01-16 MED FILL — LOSARTAN POTASSIUM 25 MG TA: 25 | 34 days supply | Qty: 34 | Fill #0

## 2019-01-31 ENCOUNTER — Ambulatory Visit: Payer: Self-pay | Admitting: Pharmacist Clinician (PhC)/ Clinical Pharmacy Specialist

## 2019-01-31 DIAGNOSIS — I8290 Acute embolism and thrombosis of unspecified vein: Secondary | ICD-10-CM

## 2019-01-31 DIAGNOSIS — Z5181 Encounter for therapeutic drug level monitoring: Secondary | ICD-10-CM

## 2019-11-17 IMAGING — MR MR CARD MORPHOLOGY WO/W CM
17 of 18 series · 39 of 40 positions shown · IV contrast (Gadavist)
Comparison: none

CLINICAL DATA: Ischemic cardiomyopathy, assess viability.

EXAM:
CARDIAC MRI
TECHNIQUE: The patient was scanned on a 1.5 Tesla GE magnet. A dedicated
cardiac coil was used. Functional imaging was done using [employer]
sequences. [DATE], and 4 chamber views were done to assess for RWMA's.
Modified Moolman rule using a short axis stack was used to
calculate an ejection fraction on a dedicated work station using
[employer] software. The patient received 7.5 cc of Gadavist. After 10
minutes inversion recovery sequences were used to assess for
infiltration and scar tissue.
CONTRAST:  7.5 cc Gadavist

[Series 5: cine_trufi_cs_rt_short axis · oblique · 8.0mm · 1.63mm/px · 23 of 864 slices shown]
[im 1/864]
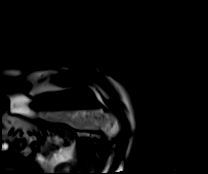
[im 40/864]
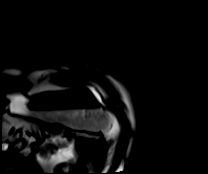
[im 79/864]
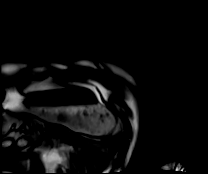
[im 118/864]
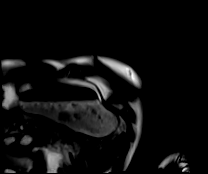
[im 157/864]
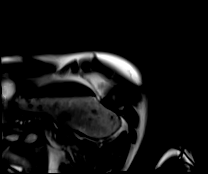
[im 197/864]
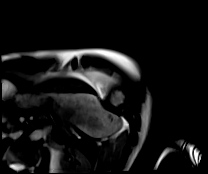
[im 236/864]
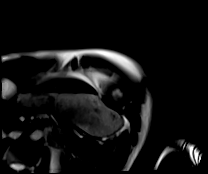
[im 275/864]
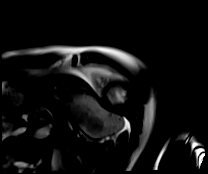
[im 314/864]
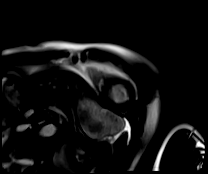
[im 354/864]
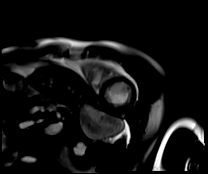
[im 393/864]
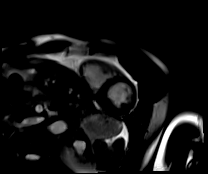
[im 432/864]
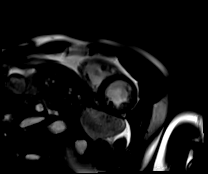
[im 471/864]
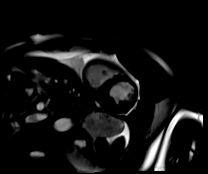
[im 510/864]
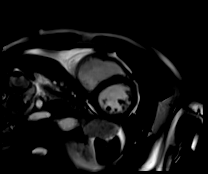
[im 550/864]
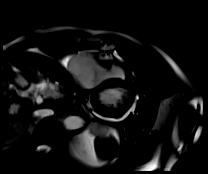
[im 589/864]
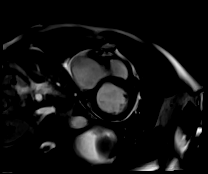
[im 628/864]
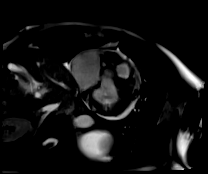
[im 667/864]
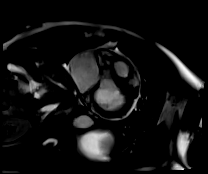
[im 707/864]
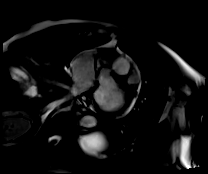
[im 746/864]
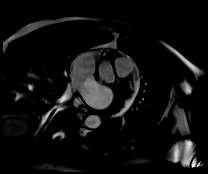
[im 785/864]
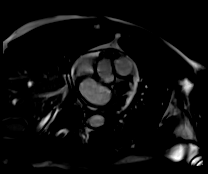
[im 824/864]
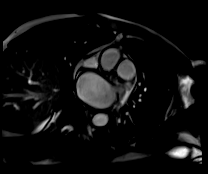
[im 864/864]
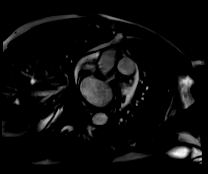

[Series 6: t2_stir_db_sax · oblique · 8.0mm · 1.63mm/px · 1 of 18 slices shown]
[im 1/18]
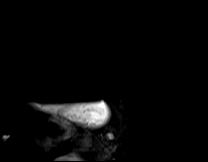

[Series 7: cine_trufi_cs_rt_radial · oblique · 6.0mm · 1.63mm/px · 1 of 39 slices shown (1 of 3)]
[im 1/39]
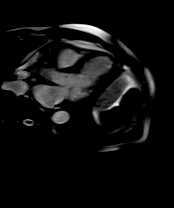

[Series 8: cine_trufi_cs_rt_radial · axial · 6.0mm · 1.63mm/px · 1 of 39 slices shown (2 of 3)]
[im 1/39]
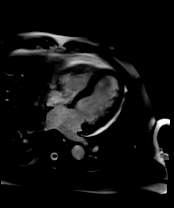

[Series 9: cine_trufi_cs_rt_radial · coronal · 6.0mm · 1.63mm/px · 1 of 39 slices shown (3 of 3)]
[im 1/39]
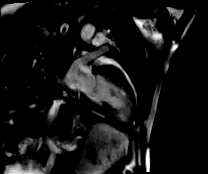

[Series 11: lge_single shot sa · oblique · 8.0mm · 1.82mm/px · 1 of 18 slices shown (1 of 4)]
[im 1/18]
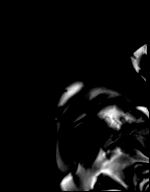

[Series 12: lge_single shot sa · oblique · 8.0mm · 1.82mm/px · 1 of 18 slices shown (2 of 4)]
[im 1/18]
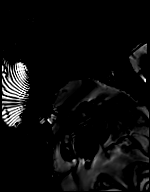

[Series 14: lge_single shot sa · oblique · 8.0mm · 1.82mm/px · 1 of 18 slices shown (3 of 4)]
[im 1/18]
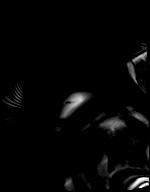

[Series 15: lge_single shot sa · oblique · 8.0mm · 1.82mm/px · 1 of 18 slices shown (4 of 4)]
[im 1/18]
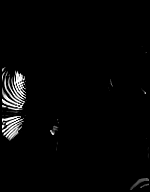

[Series 16: lge_single shot radial_mag · axial · 6.0mm · 1.82mm/px · 1 of 1 slices shown (1 of 3)]
[im 1/1]
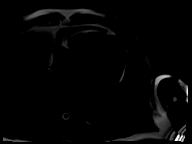

[Series 17: lge_single shot radial_psir · axial · 6.0mm · 1.82mm/px · 1 of 1 slices shown (1 of 3)]
[im 1/1]
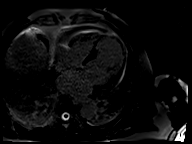

[Series 22: lge short axis_mag · oblique · 8.0mm · 1.61mm/px · 1 of 18 slices shown]
[im 1/18]
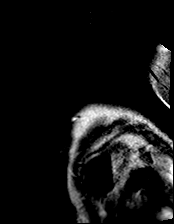

[Series 23: lge short axis_psir · oblique · 8.0mm · 1.61mm/px · 1 of 18 slices shown]
[im 1/18]
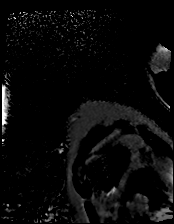

[Series 24: lge_single shot radial_mag · axial · 6.0mm · 1.98mm/px · 1 of 1 slices shown (2 of 3)]
[im 1/1]
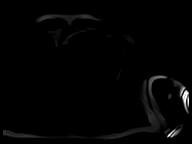

[Series 25: lge_single shot radial_psir · axial · 6.0mm · 1.98mm/px · 1 of 1 slices shown (2 of 3)]
[im 1/1]
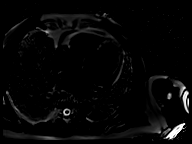

[Series 30: lge_single shot radial_mag · axial · 6.0mm · 1.98mm/px · 1 of 1 slices shown (3 of 3)]
[im 1/1]
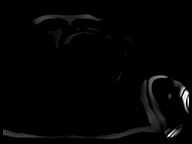

[Series 31: lge_single shot radial_psir · axial · 6.0mm · 1.98mm/px · 1 of 1 slices shown (3 of 3)]
[im 1/1]
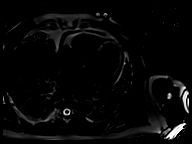

[39 of 40 positions shown; findings below may reference images not displayed]

FINDINGS: Limited images of the lung fields showed no gross abnormalities.

There was a small pericardial effusion. Normal left ventricular size
and wall thickness. There was akinesis of the mid-apical anterior,
anteroseptal and inferoseptal wall segments, the apical inferior
wall, and the true apex. Moderate systolic dysfunction, EF 37%. No
LV thrombus was seen. Normal right ventricular size with borderline
decreased systolic function, EF 43%. Mildly dialted left atrium.
Normal right atrial size. Trileaflet aortic valve with no
regurgitation or stenosis. No significant mitral regurgitation.

Delayed enhancement imaging showed 76-99% wall thickness
subendocardial late gadolinium enhancement in the mid-apical
anterior, anteroseptal and inferoseptal wall segments, the apical
inferior wall, and the true apex. There was no-reflow in some areas.

Measurements:

LVEDV 166 mL

LVSV 62 mL

LVEF 37%

RVEDV 81 mL

RVSV 62 mL
RVEF 43%
IMPRESSION: 1. Normal LV size with wall motion abnormalities in an LAD infarct
pattern, EF 37%.

2. Normal RV size with borderline decreased systolic function, EF
43%.

3. Delayed enhancement images suggest that the akinetic wall
segments have significant coronary-pattern scar present and are
unlikely to improve with revascularization.

Arsenia Ragland
